# Patient Record
Sex: Female | Born: 1945
Health system: Southern US, Community
[De-identification: ages and names within clinical notes are randomized; demographics above are authoritative.]

## PROBLEM LIST (undated history)

## (undated) DIAGNOSIS — F329 Major depressive disorder, single episode, unspecified: Secondary | ICD-10-CM

## (undated) DIAGNOSIS — F32A Depression, unspecified: Secondary | ICD-10-CM

## (undated) DIAGNOSIS — I10 Essential (primary) hypertension: Secondary | ICD-10-CM

## (undated) HISTORY — PX: TONSILLECTOMY: SUR1361

## (undated) HISTORY — PX: ANKLE SURGERY: SHX546

---

## 1997-07-12 ENCOUNTER — Ambulatory Visit (HOSPITAL_COMMUNITY): Admission: RE | Admit: 1997-07-12 | Discharge: 1997-07-12 | Payer: Self-pay | Admitting: Family Medicine

## 2001-10-26 ENCOUNTER — Encounter: Payer: Self-pay | Admitting: *Deleted

## 2001-10-26 ENCOUNTER — Emergency Department (HOSPITAL_COMMUNITY): Admission: EM | Admit: 2001-10-26 | Discharge: 2001-10-26 | Payer: Self-pay | Admitting: *Deleted

## 2003-01-10 ENCOUNTER — Ambulatory Visit (HOSPITAL_BASED_OUTPATIENT_CLINIC_OR_DEPARTMENT_OTHER): Admission: RE | Admit: 2003-01-10 | Discharge: 2003-01-10 | Payer: Self-pay | Admitting: Family Medicine

## 2004-04-16 ENCOUNTER — Ambulatory Visit: Payer: Self-pay | Admitting: *Deleted

## 2004-07-23 ENCOUNTER — Ambulatory Visit: Payer: Self-pay | Admitting: Family Medicine

## 2004-07-26 ENCOUNTER — Encounter: Admission: RE | Admit: 2004-07-26 | Discharge: 2004-07-26 | Payer: Self-pay | Admitting: Family Medicine

## 2004-08-10 ENCOUNTER — Ambulatory Visit: Payer: Self-pay | Admitting: Family Medicine

## 2004-10-11 ENCOUNTER — Ambulatory Visit: Payer: Self-pay | Admitting: Family Medicine

## 2010-05-01 ENCOUNTER — Emergency Department (HOSPITAL_COMMUNITY): Payer: Medicare Other

## 2010-05-01 ENCOUNTER — Emergency Department (HOSPITAL_COMMUNITY)
Admission: EM | Admit: 2010-05-01 | Discharge: 2010-05-01 | Disposition: A | Discharge status: Home / Self Care | Payer: Medicare Other | Attending: Emergency Medicine | Admitting: Emergency Medicine

## 2010-05-01 DIAGNOSIS — E669 Obesity, unspecified: Secondary | ICD-10-CM | POA: Insufficient documentation

## 2010-05-01 DIAGNOSIS — M25569 Pain in unspecified knee: Secondary | ICD-10-CM | POA: Insufficient documentation

## 2010-05-01 DIAGNOSIS — I10 Essential (primary) hypertension: Secondary | ICD-10-CM | POA: Insufficient documentation

## 2010-05-01 DIAGNOSIS — X500XXA Overexertion from strenuous movement or load, initial encounter: Secondary | ICD-10-CM | POA: Insufficient documentation

## 2010-05-01 DIAGNOSIS — R229 Localized swelling, mass and lump, unspecified: Secondary | ICD-10-CM | POA: Insufficient documentation

## 2013-06-26 ENCOUNTER — Emergency Department (INDEPENDENT_AMBULATORY_CARE_PROVIDER_SITE_OTHER): Payer: Medicare Other

## 2013-06-26 ENCOUNTER — Emergency Department (INDEPENDENT_AMBULATORY_CARE_PROVIDER_SITE_OTHER)
Admission: EM | Admit: 2013-06-26 | Discharge: 2013-06-26 | Disposition: A | Discharge status: Home / Self Care | Payer: Medicare Other | Source: Home / Self Care | Attending: Family Medicine | Admitting: Family Medicine

## 2013-06-26 ENCOUNTER — Encounter (HOSPITAL_COMMUNITY): Payer: Self-pay | Admitting: Emergency Medicine

## 2013-06-26 DIAGNOSIS — M19019 Primary osteoarthritis, unspecified shoulder: Secondary | ICD-10-CM

## 2013-06-26 DIAGNOSIS — M19011 Primary osteoarthritis, right shoulder: Secondary | ICD-10-CM

## 2013-06-26 DIAGNOSIS — I10 Essential (primary) hypertension: Secondary | ICD-10-CM

## 2013-06-26 HISTORY — DX: Depression, unspecified: F32.A

## 2013-06-26 HISTORY — DX: Essential (primary) hypertension: I10

## 2013-06-26 HISTORY — DX: Major depressive disorder, single episode, unspecified: F32.9

## 2013-06-26 MED ORDER — LISINOPRIL-HYDROCHLOROTHIAZIDE 20-12.5 MG PO TABS: 1.0000 | ORAL_TABLET | Freq: Every day | ORAL | Status: DC

## 2013-06-26 MED ORDER — MELOXICAM 7.5 MG PO TABS: 7.5000 mg | ORAL_TABLET | Freq: Every day | ORAL | Status: DC

## 2013-06-26 MED ORDER — AMLODIPINE BESYLATE 5 MG PO TABS: 10.0000 mg | ORAL_TABLET | Freq: Every day | ORAL | Status: DC

## 2013-06-26 NOTE — ED Provider Notes (Signed)
CSN: [id number]     Arrival date & time 06/26/13  1727 History   First MD Initiated Contact with Patient 06/26/13 1753     Chief Complaint  Patient presents with  . Shoulder Pain   (Consider location/radiation/quality/duration/timing/severity/associated sxs/prior Treatment) Patient is a 68 y.o. female presenting with shoulder pain. The history is provided by the patient. No language interpreter was used.  Shoulder Pain This is a new problem. The current episode started more than 1 week ago. The problem occurs constantly. The problem has been gradually worsening. Pertinent negatives include no chest pain, no abdominal pain, no headaches and no shortness of breath. Nothing aggravates the symptoms. Nothing relieves the symptoms. She has tried nothing for the symptoms.  Pt complains of pain in right shoulder,  Pt has had similar in the past in her left shoulder.  Pt's blood pressure is high.  She has not taken blood pressure medication in 2 years.  No current MD  Past Medical History  Diagnosis Date  . Hypertension   . Depression    Past Surgical History  Procedure Laterality Date  . Tonsillectomy    . Ankle surgery     No family history on file. History  Substance Use Topics  . Smoking status: Never Smoker   . Smokeless tobacco: Not on file  . Alcohol Use: No   OB History   Grav Para Term Preterm Abortions TAB SAB Ect Mult Living                 Review of Systems  Respiratory: Negative for shortness of breath.   Cardiovascular: Negative for chest pain.  Gastrointestinal: Negative for abdominal pain.  Neurological: Negative for headaches.  All other systems reviewed and are negative.   Allergies  Review of patient's allergies indicates no known allergies.  Home Medications   Prior to Admission medications   Not on File   BP 201/96  Pulse 78  Temp(Src) 98.5 F (36.9 C) (Oral)  Resp 20  SpO2 97% Physical Exam  Nursing note and vitals reviewed. Constitutional: She  is oriented to person, place, and time. She appears well-developed and well-nourished.  HENT:  Head: Normocephalic.  Right Ear: External ear normal.  Left Ear: External ear normal.  Eyes: Conjunctivae are normal. Pupils are equal, round, and reactive to light.  Neck: Normal range of motion.  Cardiovascular: Normal rate and normal heart sounds.   Pulmonary/Chest: Effort normal and breath sounds normal.  Abdominal: Soft. Bowel sounds are normal.  Musculoskeletal: Normal range of motion.  Tender right shoulder,  Pain with range of motion,  nv and ns intact  Neurological: She is alert and oriented to person, place, and time. She has normal reflexes.  Skin: Skin is warm.  Psychiatric: She has a normal mood and affect.    ED Course  Procedures (including critical care time) Labs Review Labs Reviewed - No data to display  Imaging Review Dg Shoulder Right  06/26/2013   CLINICAL DATA:  Right shoulder pain.  EXAM: RIGHT SHOULDER - 2+ VIEW  COMPARISON:  None.  FINDINGS: There is no acute bony or joint abnormality. The patient has acromioclavicular and glenohumeral degenerative disease. Degenerative change appears worst about the acromioclavicular joint. Image right lung and ribs are unremarkable.  IMPRESSION: No acute finding.  Acromioclavicular and glenohumeral osteoarthritis.   Electronically Signed   By: Drusilla Kannerhomas  Dalessio M.D.   On: 06/26/2013 18:36     MDM   1. Arthritis of right shoulder region  2. HTN (hypertension)    I  I discussed with Dr.  Artis FlockKindl.   I will start pt on lisinopil hctz and amlodipine.    Pt given medloxicam for shoulder.  Pt given multiple referrals and I discussed importance of bp medication/management  Elson AreasLeslie K Alba Perillo, PA-C 06/26/13 1946

## 2013-06-26 NOTE — ED Notes (Signed)
Pain in shoulder , onset 4/15.  Patient has pain in both shoulders and upper arms.  No known injury.

## 2013-06-26 NOTE — ED Provider Notes (Signed)
Medical screening examination/treatment/procedure(s) were performed by resident physician or non-physician practitioner and as supervising physician I was immediately available for consultation/collaboration.   KINDL,JAMES DOUGLAS MD.   James D Kindl, MD 06/26/13 1957 

## 2013-06-26 NOTE — Discharge Instructions (Signed)
Arterial Hypertension °Arterial hypertension (high blood pressure) is a condition of elevated pressure in your blood vessels. Hypertension over a long period of time is a risk factor for strokes, heart attacks, and heart failure. It is also the leading cause of kidney (renal) failure.  °CAUSES  °· In Adults -- Over 90% of all hypertension has no known cause. This is called essential or primary hypertension. In the other 10% of people with hypertension, the increase in blood pressure is caused by another disorder. This is called secondary hypertension. Important causes of secondary hypertension are: °· Heavy alcohol use. °· Obstructive sleep apnea. °· Hyperaldosterosim (Conn's syndrome). °· Steroid use. °· Chronic kidney failure. °· Hyperparathyroidism. °· Medications. °· Renal artery stenosis. °· Pheochromocytoma. °· Cushing's disease. °· Coarctation of the aorta. °· Scleroderma renal crisis. °· Licorice (in excessive amounts). °· Drugs (cocaine, methamphetamine). °Your caregiver can explain any items above that apply to you. °· In Children -- Secondary hypertension is more common and should always be considered. °· Pregnancy -- Few women of childbearing age have high blood pressure. However, up to 10% of them develop hypertension of pregnancy. Generally, this will not harm the woman. It may be a sign of 3 complications of pregnancy: preeclampsia, HELLP syndrome, and eclampsia. Follow up and control with medication is necessary. °SYMPTOMS  °· This condition normally does not produce any noticeable symptoms. It is usually found during a routine exam. °· Malignant hypertension is a late problem of high blood pressure. It may have the following symptoms: °· Headaches. °· Blurred vision. °· End-organ damage (this means your kidneys, heart, lungs, and other organs are being damaged). °· Stressful situations can increase the blood pressure. If a person with normal blood pressure has their blood pressure go up while being  seen by their caregiver, this is often termed "white coat hypertension." Its importance is not known. It may be related with eventually developing hypertension or complications of hypertension. °· Hypertension is often confused with mental tension, stress, and anxiety. °DIAGNOSIS  °The diagnosis is made by 3 separate blood pressure measurements. They are taken at least 1 week apart from each other. If there is organ damage from hypertension, the diagnosis may be made without repeat measurements. °Hypertension is usually identified by having blood pressure readings: °· Above 140/90 mmHg measured in both arms, at 3 separate times, over a couple weeks. °· Over 130/80 mmHg should be considered a risk factor and may require treatment in patients with diabetes. °Blood pressure readings over 120/80 mmHg are called "pre-hypertension" even in non-diabetic patients. °To get a true blood pressure measurement, use the following guidelines. Be aware of the factors that can alter blood pressure readings. °· Take measurements at least 1 hour after caffeine. °· Take measurements 30 minutes after smoking and without any stress. This is another reason to quit smoking  it raises your blood pressure. °· Use a proper cuff size. Ask your caregiver if you are not sure about your cuff size. °· Most home blood pressure cuffs are automatic. They will measure systolic and diastolic pressures. The systolic pressure is the pressure reading at the start of sounds. Diastolic pressure is the pressure at which the sounds disappear. If you are elderly, measure pressures in multiple postures. Try sitting, lying or standing. °· Sit at rest for a minimum of 5 minutes before taking measurements. °· You should not be on any medications like decongestants. These are found in many cold medications. °· Record your blood pressure readings and review   them with your caregiver. °If you have hypertension: °· Your caregiver may do tests to be sure you do not have  secondary hypertension (see "causes" above). °· Your caregiver may also look for signs of metabolic syndrome. This is also called Syndrome X or Insulin Resistance Syndrome. You may have this syndrome if you have type 2 diabetes, abdominal obesity, and abnormal blood lipids in addition to hypertension. °· Your caregiver will take your medical and family history and perform a physical exam. °· Diagnostic tests may include blood tests (for glucose, cholesterol, potassium, and kidney function), a urinalysis, or an EKG. Other tests may also be necessary depending on your condition. °PREVENTION  °There are important lifestyle issues that you can adopt to reduce your chance of developing hypertension: °· Maintain a normal weight. °· Limit the amount of salt (sodium) in your diet. °· Exercise often. °· Limit alcohol intake. °· Get enough potassium in your diet. Discuss specific advice with your caregiver. °· Follow a DASH diet (dietary approaches to stop hypertension). This diet is rich in fruits, vegetables, and low-fat dairy products, and avoids certain fats. °PROGNOSIS  °Essential hypertension cannot be cured. Lifestyle changes and medical treatment can lower blood pressure and reduce complications. The prognosis of secondary hypertension depends on the underlying cause. Many people whose hypertension is controlled with medicine or lifestyle changes can live a normal, healthy life.  °RISKS AND COMPLICATIONS  °While high blood pressure alone is not an illness, it often requires treatment due to its short- and long-term effects on many organs. Hypertension increases your risk for: °· CVAs or strokes (cerebrovascular accident). °· Heart failure due to chronically high blood pressure (hypertensive cardiomyopathy). °· Heart attack (myocardial infarction). °· Damage to the retina (hypertensive retinopathy). °· Kidney failure (hypertensive nephropathy). °Your caregiver can explain list items above that apply to you. Treatment  of hypertension can significantly reduce the risk of complications. °TREATMENT  °· For overweight patients, weight loss and regular exercise are recommended. Physical fitness lowers blood pressure. °· Mild hypertension is usually treated with diet and exercise. A diet rich in fruits and vegetables, fat-free dairy products, and foods low in fat and salt (sodium) can help lower blood pressure. Decreasing salt intake decreases blood pressure in a 1/3 of people. °· Stop smoking if you are a smoker. °The steps above are highly effective in reducing blood pressure. While these actions are easy to suggest, they are difficult to achieve. Most patients with moderate or severe hypertension end up requiring medications to bring their blood pressure down to a normal level. There are several classes of medications for treatment. Blood pressure pills (antihypertensives) will lower blood pressure by their different actions. Lowering the blood pressure by 10 mmHg may decrease the risk of complications by as much as 25%. °The goal of treatment is effective blood pressure control. This will reduce your risk for complications. Your caregiver will help you determine the best treatment for you according to your lifestyle. What is excellent treatment for one person, may not be for you. °HOME CARE INSTRUCTIONS  °· Do not smoke. °· Follow the lifestyle changes outlined in the "Prevention" section. °· If you are on medications, follow the directions carefully. Blood pressure medications must be taken as prescribed. Skipping doses reduces their benefit. It also puts you at risk for problems. °· Follow up with your caregiver, as directed. °· If you are asked to monitor your blood pressure at home, follow the guidelines in the "Diagnosis" section above. °SEEK MEDICAL CARE   IF:   You think you are having medication side effects.  You have recurrent headaches or lightheadedness.  You have swelling in your ankles.  You have trouble with  your vision. SEEK IMMEDIATE MEDICAL CARE IF:   You have sudden onset of chest pain or pressure, difficulty breathing, or other symptoms of a heart attack.  You have a severe headache.  You have symptoms of a stroke (such as sudden weakness, difficulty speaking, difficulty walking). MAKE SURE YOU:   Understand these instructions.  Will watch your condition.  Will get help right away if you are not doing well or get worse. Document Released: 02/18/2005 Document Revised: 05/13/2011 Document Reviewed: 09/18/2006 [facility] Patient Information 2014 [employer], [place].  Arthritis, Nonspecific Arthritis is inflammation of a joint. This usually means pain, redness, warmth or swelling are present. One or more joints may be involved. There are a number of types of arthritis. Your caregiver may not be able to tell what type of arthritis you have right away. CAUSES  The most common cause of arthritis is the wear and tear on the joint (osteoarthritis). This causes damage to the cartilage, which can break down over time. The knees, hips, back and neck are most often affected by this type of arthritis. Other types of arthritis and common causes of joint pain include:  Sprains and other injuries near the joint. Sometimes minor sprains and injuries cause pain and swelling that develop hours later.  Rheumatoid arthritis. This affects hands, feet and knees. It usually affects both sides of your body at the same time. It is often associated with chronic ailments, fever, weight loss and general weakness.  Crystal arthritis. Gout and pseudo gout can cause occasional acute severe pain, redness and swelling in the foot, ankle, or knee.  Infectious arthritis. Bacteria can get into a joint through a break in overlying skin. This can cause infection of the joint. Bacteria and viruses can also spread through the blood and affect your joints.  Drug, infectious and allergy reactions. Sometimes joints can become mildly  painful and slightly swollen with these types of illnesses. SYMPTOMS   Pain is the main symptom.  Your joint or joints can also be red, swollen and warm or hot to the touch.  You may have a fever with certain types of arthritis, or even feel overall ill.  The joint with arthritis will hurt with movement. Stiffness is present with some types of arthritis. DIAGNOSIS  Your caregiver will suspect arthritis based on your description of your symptoms and on your exam. Testing may be needed to find the type of arthritis:  Blood and sometimes urine tests.  X-ray tests and sometimes CT or MRI scans.  Removal of fluid from the joint (arthrocentesis) is done to check for bacteria, crystals or other causes. Your caregiver (or a specialist) will numb the area over the joint with a local anesthetic, and use a needle to remove joint fluid for examination. This procedure is only minimally uncomfortable.  Even with these tests, your caregiver may not be able to tell what kind of arthritis you have. Consultation with a specialist (rheumatologist) may be helpful. TREATMENT  Your caregiver will discuss with you treatment specific to your type of arthritis. If the specific type cannot be determined, then the following general recommendations may apply. Treatment of severe joint pain includes:  Rest.  Elevation.  Anti-inflammatory medication (for example, ibuprofen) may be prescribed. Avoiding activities that cause increased pain.  Only take over-the-counter or prescription medicines for  pain and discomfort as recommended by your caregiver.  Cold packs over an inflamed joint may be used for 10 to 15 minutes every hour. Hot packs sometimes feel better, but do not use overnight. Do not use hot packs if you are diabetic without your caregiver's permission.  A cortisone shot into arthritic joints may help reduce pain and swelling.  Any acute arthritis that gets worse over the next 1 to 2 days needs to be  looked at to be sure there is no joint infection. Long-term arthritis treatment involves modifying activities and lifestyle to reduce joint stress jarring. This can include weight loss. Also, exercise is needed to nourish the joint cartilage and remove waste. This helps keep the muscles around the joint strong. HOME CARE INSTRUCTIONS   Do not take aspirin to relieve pain if gout is suspected. This elevates uric acid levels.  Only take over-the-counter or prescription medicines for pain, discomfort or fever as directed by your caregiver.  Rest the joint as much as possible.  If your joint is swollen, keep it elevated.  Use crutches if the painful joint is in your leg.  Drinking plenty of fluids may help for certain types of arthritis.  Follow your caregiver's dietary instructions.  Try low-impact exercise such as:  Swimming.  Water aerobics.  Biking.  Walking.  Morning stiffness is often relieved by a warm shower.  Put your joints through regular range-of-motion. SEEK MEDICAL CARE IF:   You do not feel better in 24 hours or are getting worse.  You have side effects to medications, or are not getting better with treatment. SEEK IMMEDIATE MEDICAL CARE IF:   You have a fever.  You develop severe joint pain, swelling or redness.  Many joints are involved and become painful and swollen.  There is severe back pain and/or leg weakness.  You have loss of bowel or bladder control. Document Released: 03/28/2004 Document Revised: 05/13/2011 Document Reviewed: 04/13/2008 [facility] Patient Information 2014 [employer], [place].

## 2013-06-28 ENCOUNTER — Emergency Department (INDEPENDENT_AMBULATORY_CARE_PROVIDER_SITE_OTHER)
Admission: EM | Admit: 2013-06-28 | Discharge: 2013-06-28 | Disposition: A | Discharge status: Home / Self Care | Payer: Medicare Other | Source: Home / Self Care | Attending: Family Medicine | Admitting: Family Medicine

## 2013-06-28 ENCOUNTER — Encounter (HOSPITAL_COMMUNITY): Payer: Self-pay | Admitting: Emergency Medicine

## 2013-06-28 DIAGNOSIS — M19011 Primary osteoarthritis, right shoulder: Secondary | ICD-10-CM

## 2013-06-28 DIAGNOSIS — M19019 Primary osteoarthritis, unspecified shoulder: Secondary | ICD-10-CM

## 2013-06-28 MED ORDER — TRAMADOL HCL 50 MG PO TABS: 50.0000 mg | ORAL_TABLET | Freq: Two times a day (BID) | ORAL | Status: DC | PRN

## 2013-06-28 NOTE — ED Provider Notes (Signed)
CSN: [id number]     Arrival date & time 06/28/13  1556 History   First MD Initiated Contact with Patient 06/28/13 1743     Chief Complaint  Patient presents with  . Shoulder Pain   (Consider location/radiation/quality/duration/timing/severity/associated sxs/prior Treatment) HPI Comments: Patient presents with 2 weeks of right shoulder discomfort. [facility] and diagnosed with OA of right shoulder and prescribed Mobic. States this medication has only brought her limited relief. Requests additional pain medication until she can be seen by her sport medicine MD, Dr. Jennette KettleNeal, on Jul 12, 2013. No new illness or injury. Pain described as aching in right anterior and lower shoulder.  The history is provided by the patient.    Past Medical History  Diagnosis Date  . Hypertension   . Depression    Past Surgical History  Procedure Laterality Date  . Tonsillectomy    . Ankle surgery     History reviewed. No pertinent family history. History  Substance Use Topics  . Smoking status: Never Smoker   . Smokeless tobacco: Not on file  . Alcohol Use: No   OB History   Grav Para Term Preterm Abortions TAB SAB Ect Mult Living                 Review of Systems  All other systems reviewed and are negative.   Allergies  Review of patient's allergies indicates no known allergies.  Home Medications   Prior to Admission medications   Medication Sig Start Date End Date Taking? Authorizing Provider  amLODipine (NORVASC) 5 MG tablet Take 2 tablets (10 mg total) by mouth daily. 06/26/13   Elson AreasLeslie K Sofia, PA-C  lisinopril-hydrochlorothiazide (ZESTORETIC) 20-12.5 MG per tablet Take 1 tablet by mouth daily. 06/26/13   Elson AreasLeslie K Sofia, PA-C  meloxicam (MOBIC) 7.5 MG tablet Take 1 tablet (7.5 mg total) by mouth daily. 06/26/13   Elson AreasLeslie K Sofia, PA-C   BP 145/57  Pulse 81  Temp(Src) 98.6 F (37 C) (Oral)  Resp 16  SpO2 98% Physical Exam  Vitals  reviewed. Constitutional: She is oriented to person, place, and time. She appears well-developed and well-nourished.  HENT:  Head: Normocephalic and atraumatic.  Eyes: Conjunctivae are normal. No scleral icterus.  Cardiovascular: Normal rate.   Pulmonary/Chest: Effort normal.  Musculoskeletal: Normal range of motion.       Right shoulder: She exhibits normal range of motion, no swelling, no effusion, no crepitus, no deformity, no laceration, normal pulse and normal strength.       Arms: Neurological: She is alert and oriented to person, place, and time.  Skin: Skin is warm and dry.  Psychiatric: She has a normal mood and affect. Her behavior is normal.    ED Course  Procedures (including critical care time) Labs Review Labs Reviewed - No data to display  Imaging Review No results found.   MDM   1. Osteoarthritis of right shoulder   Will add Ultram to patient's pain management regimen and advise continued follow up with Dr. Jennette KettleNeal in May 2015.     Jess BartersJennifer Lee ArenaPresson, GeorgiaPA 06/28/13 1944

## 2013-06-28 NOTE — ED Notes (Signed)
C/o right shoulder pain States both shoulders does hurt but more the right shoulder Patient was seen here and received mobic  States med is not controlling pain States she needs a pain med

## 2013-06-28 NOTE — ED Provider Notes (Signed)
Medical screening examination/treatment/procedure(s) were performed by resident physician or non-physician practitioner and as supervising physician I was immediately available for consultation/collaboration.   Marquavion Venhuizen DOUGLAS MD.   Jeyda Siebel D Zayvier Caravello, MD 06/28/13 2107 

## 2013-06-28 NOTE — Discharge Instructions (Signed)
Osteoarthritis Osteoarthritis is a disease that causes soreness and swelling (inflammation) of a joint. It occurs when the cartilage at the affected joint wears down. Cartilage acts as a cushion, covering the ends of bones where they meet to form a joint. Osteoarthritis is the most common form of arthritis. It often occurs in older people. The joints affected most often by this condition include those in the:  Ends of the fingers.  Thumbs.  Neck.  Lower back.  Knees.  Hips. CAUSES  Over time, the cartilage that covers the ends of bones begins to wear away. This causes bone to rub on bone, producing pain and stiffness in the affected joints.  RISK FACTORS Certain factors can increase your chances of having osteoarthritis, including:  Older age.  Excessive body weight.  Overuse of joints. SIGNS AND SYMPTOMS   Pain, swelling, and stiffness in the joint.  Over time, the joint may lose its normal shape.  Small deposits of bone (osteophytes) may grow on the edges of the joint.  Bits of bone or cartilage can break off and float inside the joint space. This may cause more pain and damage. DIAGNOSIS  Your health care provider will do a physical exam and ask about your symptoms. Various tests may be ordered, such as:  X-rays of the affected joint.  An MRI scan.  Blood tests to rule out other types of arthritis.  Joint fluid tests. This involves using a needle to draw fluid from the joint and examining the fluid under a microscope. TREATMENT  Goals of treatment are to control pain and improve joint function. Treatment plans may include:  A prescribed exercise program that allows for rest and joint relief.  A weight control plan.  Pain relief techniques, such as:  Properly applied heat and cold.  Electric pulses delivered to nerve endings under the skin (transcutaneous electrical nerve stimulation, TENS).  Massage.  Certain nutritional supplements.  Medicines to  control pain, such as:  Acetaminophen.  Nonsteroidal anti-inflammatory drugs (NSAIDs), such as naproxen.  Narcotic or central-acting agents, such as tramadol.  Corticosteroids. These can be given orally or as an injection.  Surgery to reposition the bones and relieve pain (osteotomy) or to remove loose pieces of bone and cartilage. Joint replacement may be needed in advanced states of osteoarthritis. HOME CARE INSTRUCTIONS   Only take over-the-counter or prescription medicines as directed by your health care provider. Take all medicines exactly as instructed.  Maintain a healthy weight. Follow your health care provider's instructions for weight control. This may include dietary instructions.  Exercise as directed. Your health care provider can recommend specific types of exercise. These may include:  Strengthening exercises These are done to strengthen the muscles that support joints affected by arthritis. They can be performed with weights or with exercise bands to add resistance.  Aerobic activities These are exercises, such as brisk walking or low-impact aerobics, that get your heart pumping.  Range-of-motion activities These keep your joints limber.  Balance and agility exercises These help you maintain daily living skills.  Rest your affected joints as directed by your health care provider.  Follow up with your health care provider as directed. SEEK MEDICAL CARE IF:   Your skin turns red.  You develop a rash in addition to your joint pain.  You have worsening joint pain. SEEK IMMEDIATE MEDICAL CARE IF:  You have a significant loss of weight or appetite.  You have a fever along with joint or muscle aches.  You have   night sweats. FOR MORE INFORMATION  National Institute of Arthritis and Musculoskeletal and Skin Diseases: www.niams.nih.gov National Institute on Aging: www.nia.nih.gov American College of Rheumatology: www.rheumatology.org Document Released: 02/18/2005  Document Revised: 12/09/2012 Document Reviewed: 10/26/2012 [employer] Patient Information 2014 [employer].  

## 2013-07-12 ENCOUNTER — Encounter: Payer: Self-pay | Admitting: Family Medicine

## 2013-07-12 ENCOUNTER — Ambulatory Visit (INDEPENDENT_AMBULATORY_CARE_PROVIDER_SITE_OTHER): Payer: Medicare Other | Admitting: Family Medicine

## 2013-07-12 VITALS — BP 120/80 | Ht 67.0 in | Wt 280.0 lb

## 2013-07-12 DIAGNOSIS — M25519 Pain in unspecified shoulder: Secondary | ICD-10-CM

## 2013-07-13 DIAGNOSIS — M25519 Pain in unspecified shoulder: Secondary | ICD-10-CM | POA: Insufficient documentation

## 2013-07-13 NOTE — Assessment & Plan Note (Signed)
We discussed options and decided to start her on home exercise program with followup in 4-6 weeks and when necessary.

## 2013-07-13 NOTE — Progress Notes (Signed)
   Subjective:    Patient ID: Amy Rivera, female    DOB: 1945/12/13, 68 y.o.   MRN: [id number]  HPI  Told her pain. Initially she had made the appointment for  right shoulder pain. She had been seen at urgent care for this and given some NSAIDs and tramadol which seems to have taking care of it. That pain was sharp and occurred every time she moves her shoulder. It is now almost totally resolved. Now her left shoulder is bothering her and that she has pain with overhead activities and pain at night. 4/10. No numbness or tingling in her hand.  PERTINENT  PMH / PSH: I have reviewed the patient's medications, allergies, past medical and surgical history. Pertinent findings that relate to today's visit / issues include: No prior history of shoulder injury or surgery. She does have a history of hypertension but no diagnosis of diabetes mellitus.  Review of Systems No unusual weight change, no fever sweats or chills. Has noted no redness or warmth of either shoulder. No numbness in her hands.    Objective:   Physical Exam Vital signs are reviewed GENERAL: Well-developed overweight female no acute distress Shoulders: Bilaterally symmetrical. On the right she has full strength and full range of motion in all planes the rotator cuff. The biceps tendon is nontender to palpation. She has normal biceps strength. There is no tenderness over the a.c. joint. The left shoulder reveals some very mild tenderness over the biceps tendon area but intact biceps strength. She has pain with internal rotation and supraspinatus testing. Her strength seems intact although it is somewhat difficult to determine secondary to her pain with examination. ULTRASOUND: Somewhat difficult ultrasound secondary to habitus but most of the rotator cuff was seen and am fairly confident there is no significant tear in any of the rotator cuff muscles. There some mild arthropathy at the a.c. joint but no effusion. The biceps tendon  appears to have a small amount of fluid around it and she has a peak osteophytes notable in the bicipital groove.  .        Assessment & Plan:

## 2014-08-12 ENCOUNTER — Other Ambulatory Visit: Payer: Self-pay | Admitting: Registered Nurse

## 2014-08-12 ENCOUNTER — Other Ambulatory Visit: Payer: Self-pay

## 2014-08-12 DIAGNOSIS — Z1231 Encounter for screening mammogram for malignant neoplasm of breast: Secondary | ICD-10-CM

## 2014-08-16 ENCOUNTER — Ambulatory Visit
Admission: RE | Admit: 2014-08-16 | Discharge: 2014-08-16 | Disposition: A | Discharge status: Home / Self Care | Payer: Medicare Other | Source: Ambulatory Visit | Attending: Registered Nurse | Admitting: Registered Nurse

## 2014-08-16 DIAGNOSIS — Z1231 Encounter for screening mammogram for malignant neoplasm of breast: Secondary | ICD-10-CM

## 2017-09-12 ENCOUNTER — Ambulatory Visit (HOSPITAL_COMMUNITY)
Admission: EM | Admit: 2017-09-12 | Discharge: 2017-09-12 | Disposition: A | Discharge status: Home / Self Care | Payer: Medicare Other | Attending: Family Medicine | Admitting: Family Medicine

## 2017-09-12 ENCOUNTER — Encounter (HOSPITAL_COMMUNITY): Payer: Self-pay

## 2017-09-12 DIAGNOSIS — R21 Rash and other nonspecific skin eruption: Secondary | ICD-10-CM | POA: Diagnosis not present

## 2017-09-12 DIAGNOSIS — I10 Essential (primary) hypertension: Secondary | ICD-10-CM

## 2017-09-12 MED ORDER — HYDROXYZINE HCL 25 MG PO TABS: 25.0000 mg | ORAL_TABLET | ORAL | 0 refills | Status: DC | PRN

## 2017-09-12 MED ORDER — ZESTORETIC 20-12.5 MG PO TABS: 1.0000 | ORAL_TABLET | Freq: Every day | ORAL | 0 refills | Status: DC

## 2017-09-12 MED ORDER — TRIAMCINOLONE ACETONIDE 0.1 % EX CREA: 1.0000 "application " | TOPICAL_CREAM | Freq: Two times a day (BID) | CUTANEOUS | 0 refills | Status: DC

## 2017-09-12 NOTE — ED Triage Notes (Signed)
Pt presents with left arm rash that is swollen and red

## 2017-09-12 NOTE — Discharge Instructions (Signed)
Use the cream 2 or 3 times a day to reduce rash and itching Hydroxyzine is a pill to take for itching.  Caution drowsiness. I have refilled your blood pressure medicine.  It is important to see a PCP and stay on blood pressure medication. Return as needed

## 2017-09-12 NOTE — ED Provider Notes (Signed)
[facility]    CSN: [id number] Arrival date & time: 09/12/17  4098     History   Chief Complaint Chief Complaint  Patient presents with  . Rash    left arm    HPI Amy Rivera is a 72 y.o. female.   HPI  Patient is here complaining of a rash on both arms.  Is been present for couple weeks.  It itches terribly.  She has not put any creams on it.  No new soap lotion or powder.  No new foods or medication.  He feels like it is spreading.  She is never had this rash before.  Does not have sensitive skin or eczema as a role. She also has hypertension.  She states she stopped all her medication "months ago".  She does not know if she still can go to her PCP because it has been so long.  She is not having any headaches or dizzy spells.  She was unaware that her blood pressure is elevated.  I explained to her the importance of staying on her blood pressure medicine for long-term maintenance and prevention of heart disease.  Putting her back on 1 of her blood pressure pills, and have advised her to follow-up her blood pressure.  I am going to refer her to a PCP for ongoing management.  Past Medical History:  Diagnosis Date  . Depression   . Hypertension     Patient Active Problem List   Diagnosis Date Noted  . Pain in joint, shoulder region 07/13/2013    Past Surgical History:  Procedure Laterality Date  . ANKLE SURGERY    . TONSILLECTOMY      OB History   None      Home Medications    Prior to Admission medications   Medication Sig Start Date End Date Taking? Authorizing Provider  hydrOXYzine (ATARAX/VISTARIL) 25 MG tablet Take 1-2 tablets (25-50 mg total) by mouth every 4 (four) hours as needed. 09/12/17   Eustace Moore, MD  triamcinolone cream (KENALOG) 0.1 % Apply 1 application topically 2 (two) times daily. 09/12/17   Eustace Moore, MD  ZESTORETIC 20-12.5 MG tablet Take 1 tablet by mouth daily. 09/12/17   Eustace Moore, MD    Family  History History reviewed. No pertinent family history. Patient states family history of hypertension and heart disease  social History Social History   Tobacco Use  . Smoking status: Never Smoker  Substance Use Topics  . Alcohol use: No  . Drug use: No     Allergies   Patient has no known allergies.   Review of Systems Review of Systems  Constitutional: Negative for chills and fever.  HENT: Negative for ear pain and sore throat.   Eyes: Negative for photophobia, pain and visual disturbance.  Respiratory: Negative for cough and shortness of breath.   Cardiovascular: Negative for chest pain and palpitations.  Gastrointestinal: Negative for abdominal pain and vomiting.  Genitourinary: Negative for dysuria and hematuria.  Musculoskeletal: Negative for arthralgias and back pain.  Skin: Positive for rash. Negative for color change.  Neurological: Negative for seizures, syncope and headaches.  Psychiatric/Behavioral: Positive for sleep disturbance.       Rash keeps her awake  All other systems reviewed and are negative.    Physical Exam Triage Vital Signs ED Triage Vitals  Enc Vitals Group     BP 09/12/17 0836 (!) 184/91     Pulse Rate 09/12/17 0836 89  Resp 09/12/17 0836 18     Temp 09/12/17 0836 98.3 F (36.8 C)     Temp Source 09/12/17 0836 Oral     SpO2 09/12/17 0836 97 %     Weight --      Height --      Head Circumference --      Peak Flow --      Pain Score 09/12/17 0834 1     Pain Loc --      Pain Edu? --      Excl. in GC? --    No data found.  Updated Vital Signs BP (!) 184/91 (BP Location: Right Arm)   Pulse 89   Temp 98.3 F (36.8 C) (Oral)   Resp 18   SpO2 97%      Physical Exam  Constitutional: She appears well-developed and well-nourished. No distress.  HENT:  Head: Normocephalic and atraumatic.  Mouth/Throat: Oropharynx is clear and moist.  Eyes: Pupils are equal, round, and reactive to light. Conjunctivae are normal.  Neck: Normal  range of motion. Neck supple.  Cardiovascular: Normal rate, regular rhythm and normal heart sounds.  Pulmonary/Chest: Effort normal. No stridor. No respiratory distress. She has no wheezes.  Abdominal: Soft. She exhibits no distension.  Musculoskeletal: Normal range of motion. She exhibits no edema.  Neurological: She is alert.  Skin: Skin is warm and dry.  Both forearms with scattered small papules, some excoriated.  No vesicles.  No soft tissue swelling or tenderness.  Psychiatric: She has a normal mood and affect. Her behavior is normal.     UC Treatments / Results  Labs (all labs ordered are listed, but only abnormal results are displayed) Labs Reviewed - No data to display  EKG None  Radiology No results found.  Procedures Procedures (including critical care time)  Medications Ordered in UC Medications - No data to display  Initial Impression / Assessment and Plan / UC Course  I have reviewed the triage vital signs and the nursing notes.  Pertinent labs & imaging results that were available during my care of the patient were reviewed by me and considered in my medical decision making (see chart for details).      Final Clinical Impressions(s) / UC Diagnoses   Final diagnoses:  Rash and nonspecific skin eruption  Essential hypertension     Discharge Instructions     Use the cream 2 or 3 times a day to reduce rash and itching Hydroxyzine is a pill to take for itching.  Caution drowsiness. I have refilled your blood pressure medicine.  It is important to see a PCP and stay on blood pressure medication. Return as needed   ED Prescriptions    Medication Sig Dispense Auth. Provider   ZESTORETIC 20-12.5 MG tablet Take 1 tablet by mouth daily. 90 tablet Eustace MooreNelson, Yvonne Sue, MD   hydrOXYzine (ATARAX/VISTARIL) 25 MG tablet Take 1-2 tablets (25-50 mg total) by mouth every 4 (four) hours as needed. 20 tablet Eustace MooreNelson, Yvonne Sue, MD   triamcinolone cream (KENALOG) 0.1 %  Apply 1 application topically 2 (two) times daily. 30 g Eustace MooreNelson, Yvonne Sue, MD     Controlled Substance Prescriptions [place] Controlled Substance Registry consulted? Not Applicable   Eustace MooreNelson, Yvonne Sue, MD 09/12/17 1123

## 2018-03-07 ENCOUNTER — Ambulatory Visit (HOSPITAL_COMMUNITY)
Admission: EM | Admit: 2018-03-07 | Discharge: 2018-03-07 | Disposition: A | Discharge status: Home / Self Care | Payer: Medicare Other | Attending: Family Medicine | Admitting: Family Medicine

## 2018-03-07 ENCOUNTER — Encounter (HOSPITAL_COMMUNITY): Payer: Self-pay

## 2018-03-07 ENCOUNTER — Other Ambulatory Visit: Payer: Self-pay

## 2018-03-07 DIAGNOSIS — M5442 Lumbago with sciatica, left side: Secondary | ICD-10-CM | POA: Insufficient documentation

## 2018-03-07 DIAGNOSIS — G8929 Other chronic pain: Secondary | ICD-10-CM | POA: Insufficient documentation

## 2018-03-07 MED ORDER — PREDNISONE 20 MG PO TABS: 20.0000 mg | ORAL_TABLET | Freq: Two times a day (BID) | ORAL | 0 refills | Status: AC

## 2018-03-07 NOTE — Discharge Instructions (Signed)
Continue conservative management of rest, ice, and gentle stretches Prescribed prednisone.  Take as directed and to completion Follow up with Amy Courts FNP to establish care Return or go to the ER if you have any new or worsening symptoms (fever, chills, chest pain, abdominal pain, changes in bowel or bladder habits, pain radiating into lower legs, numbness or tingling in pelvis etc...)

## 2018-03-07 NOTE — ED Triage Notes (Signed)
Pt cc left leg pain x 1 week . Pt not sure if she fell or not. ( pain and swelling )

## 2018-03-07 NOTE — ED Provider Notes (Signed)
[facility]   [id number] 03/07/18 Arrival Time: 1357  CC: Left leg pain  SUBJECTIVE: History from: patient. Amy Rivera is a 73 y.o. female complains of left lower extremity pain that began 1 week ago.  Denies a precipitating event or specific injury.  Localizes the pain to the low back and radiates down left leg.  Describes the pain as constant and sharp in character.  Has tried OTC medications with temporary relief.  Symptoms are made worse with raising left leg.  Denies similar symptoms in the past.  Denies fever, chills, nausea, vomiting, erythema, ecchymosis, effusion, weakness, numbness and tingling, saddle paresthesia, loss of bowel or bladder function.    ROS: As per HPI.  Past Medical History:  Diagnosis Date  . Depression   . Hypertension    Past Surgical History:  Procedure Laterality Date  . ANKLE SURGERY    . TONSILLECTOMY     No Known Allergies No current facility-administered medications on file prior to encounter.    No current outpatient medications on file prior to encounter.   Social History   Socioeconomic History  . Marital status: Divorced    Spouse name: Not on file  . Number of children: Not on file  . Years of education: Not on file  . Highest education level: Not on file  Occupational History  . Not on file  Social Needs  . Financial resource strain: Not on file  . Food insecurity:    Worry: Not on file    Inability: Not on file  . Transportation needs:    Medical: Not on file    Non-medical: Not on file  Tobacco Use  . Smoking status: Never Smoker  . Smokeless tobacco: Never Used  Substance and Sexual Activity  . Alcohol use: No  . Drug use: No  . Sexual activity: Not on file  Lifestyle  . Physical activity:    Days per week: Not on file    Minutes per session: Not on file  . Stress: Not on file  Relationships  . Social connections:    Talks on phone: Not on file    Gets together: Not on file    Attends religious  service: Not on file    Active member of club or organization: Not on file    Attends meetings of clubs or organizations: Not on file    Relationship status: Not on file  . Intimate partner violence:    Fear of current or ex partner: Not on file    Emotionally abused: Not on file    Physically abused: Not on file    Forced sexual activity: Not on file  Other Topics Concern  . Not on file  Social History Narrative  . Not on file   History reviewed. No pertinent family history.  OBJECTIVE:  Vitals:   03/07/18 1537 03/07/18 1540  BP:  (!) 160/75  Pulse:  72  Resp:  18  Temp:  98.6 F (37 C)  SpO2:  100%  Weight: 265 lb (120.2 kg)     General appearance: AOx3; in no acute distress.  Head: NCAT Lungs: CTA bilaterally Heart: RRR.   Musculoskeletal: Lumbar; limited due to pt sitting in wheelchair Inspection: Skin warm, dry, clear and intact without obvious erythema, effusion, or ecchymosis.  Palpation: TTP to LT low back; no midline tenderness ROM: unable to assess Strength: 5/5 hip flexion, 5/5 knee abduction, 5/5 knee adduction, 5/5 knee flexion, 5/5 knee extension, 5/5 dorsiflexion, 5/5 plantar flexion +  SLR on lt Skin: warm and dry Neurologic: Sitting in wheelchair; Sensation intact about the lower extremities Psychological: alert and cooperative; normal mood and affect  ASSESSMENT & PLAN:  1. Chronic left-sided low back pain with left-sided sciatica     Meds ordered this encounter  Medications  . predniSONE (DELTASONE) 20 MG tablet    Sig: Take 1 tablet (20 mg total) by mouth 2 (two) times daily with a meal for 5 days.    Dispense:  10 tablet    Refill:  0    Order Specific Question:   Supervising Provider    Answer:   Eustace Moore [[id number]]    Continue conservative management of rest, ice, and gentle stretches Prescribed prednisone.  Take as directed and to completion Follow up with Joaquin Courts FNP to establish care Return or go to the ER if you  have any new or worsening symptoms (fever, chills, chest pain, abdominal pain, changes in bowel or bladder habits, pain radiating into lower legs, numbness or tingling in pelvis etc...)   Reviewed expectations re: course of current medical issues. Questions answered. Outlined signs and symptoms indicating need for more acute intervention. Patient verbalized understanding. After Visit Summary given.    Rennis Harding, PA-C 03/07/18 1644

## 2018-03-11 ENCOUNTER — Telehealth (HOSPITAL_COMMUNITY): Payer: Self-pay | Admitting: Emergency Medicine

## 2018-03-11 ENCOUNTER — Ambulatory Visit (HOSPITAL_COMMUNITY)
Admission: EM | Admit: 2018-03-11 | Discharge: 2018-03-11 | Disposition: A | Discharge status: Home / Self Care | Payer: Medicare Other | Attending: Family Medicine | Admitting: Family Medicine

## 2018-03-11 DIAGNOSIS — B029 Zoster without complications: Secondary | ICD-10-CM | POA: Diagnosis not present

## 2018-03-11 MED ORDER — TRAMADOL HCL 50 MG PO TABS
50.0000 mg | ORAL_TABLET | Freq: Two times a day (BID) | ORAL | 0 refills | Status: AC | PRN
Start: 2018-03-11 — End: 2018-03-16

## 2018-03-11 MED ORDER — VALACYCLOVIR HCL 1 G PO TABS: 1000.0000 mg | ORAL_TABLET | Freq: Three times a day (TID) | ORAL | 0 refills | Status: AC

## 2018-03-11 NOTE — ED Notes (Signed)
Bed: UC01 Expected date:  Expected time:  Means of arrival:  Comments: For appointment 

## 2018-03-11 NOTE — Discharge Instructions (Signed)
Continue with prednisone Rest and use ice/heat as needed for symptomatic relief Prescribed valacyclovir 1000mg  3x/day for 10 days Use OTC medications such as ibuprofen/ tylenol.  Use tramadol as needed for break-through pain Follow up with PCP in 7-10 days if rash is still present Follow up with PCP if symptoms of burning, stinging, tingling or numbness occur after rash resolves, you may need additional treatment Return here or go to ER if you have any new or worsening symptoms (such as eye involvement, severe pain, or signs of secondary infection such as fever, chills, nausea, vomiting, discharge, redness or warmth over site of rash)

## 2018-03-11 NOTE — ED Provider Notes (Signed)
[facility]   [id number] 03/11/18 Arrival Time: 1650  CC: Rash  SUBJECTIVE:  Shawnika Riskin is a 73 y.o. female who presents with a rash on left leg that began 2 days ago.  Was seen on 03/07/17 and treated with prednisone for sciatica.  Localizes the rash to left leg.  Describes it as itchy and red.  Has tried been taking prednisone with minimal relief.  Denies aggravating factors. Reports similar symptoms in the past.  Denies fever, chills, nausea, vomiting, discharge, SOB, chest pain, abdominal pain, changes in bowel or bladder function.   ROS: As per HPI.  Past Medical History:  Diagnosis Date  . Depression   . Hypertension    Past Surgical History:  Procedure Laterality Date  . ANKLE SURGERY    . TONSILLECTOMY     No Known Allergies No current facility-administered medications on file prior to encounter.    Current Outpatient Medications on File Prior to Encounter  Medication Sig Dispense Refill  . predniSONE (DELTASONE) 20 MG tablet Take 1 tablet (20 mg total) by mouth 2 (two) times daily with a meal for 5 days. 10 tablet 0   Social History   Socioeconomic History  . Marital status: Divorced    Spouse name: Not on file  . Number of children: Not on file  . Years of education: Not on file  . Highest education level: Not on file  Occupational History  . Not on file  Social Needs  . Financial resource strain: Not on file  . Food insecurity:    Worry: Not on file    Inability: Not on file  . Transportation needs:    Medical: Not on file    Non-medical: Not on file  Tobacco Use  . Smoking status: Never Smoker  . Smokeless tobacco: Never Used  Substance and Sexual Activity  . Alcohol use: No  . Drug use: No  . Sexual activity: Not on file  Lifestyle  . Physical activity:    Days per week: Not on file    Minutes per session: Not on file  . Stress: Not on file  Relationships  . Social connections:    Talks on phone: Not on file    Gets together: Not  on file    Attends religious service: Not on file    Active member of club or organization: Not on file    Attends meetings of clubs or organizations: Not on file    Relationship status: Not on file  . Intimate partner violence:    Fear of current or ex partner: Not on file    Emotionally abused: Not on file    Physically abused: Not on file    Forced sexual activity: Not on file  Other Topics Concern  . Not on file  Social History Narrative  . Not on file   No family history on file.  OBJECTIVE: Vitals:   03/11/18 1717  BP: 136/83  Pulse: 89  Resp: 18  Temp: 97.9 F (36.6 C)  TempSrc: Oral  SpO2: 97%    General appearance: alert; no distress Head: NCAT Lungs: normal respiratory effort Extremities: no edema Skin: warm and dry; crops of vesicular lesions with surrounding erythema along L3 dermatome, does not cross midline; mildly tender to palpation; no obvious drainage (see picture below) Psychological: alert and cooperative; normal mood and affect        ASSESSMENT & PLAN:  1. Herpes zoster without complication     Meds ordered this  encounter  Medications  . valACYclovir (VALTREX) 1000 MG tablet    Sig: Take 1 tablet (1,000 mg total) by mouth 3 (three) times daily for 10 days.    Dispense:  30 tablet    Refill:  0    Order Specific Question:   Supervising Provider    Answer:   Eustace Moore [[id number]]  . traMADol (ULTRAM) 50 MG tablet    Sig: Take 1 tablet (50 mg total) by mouth every 12 (twelve) hours as needed for up to 5 days for moderate pain or severe pain.    Dispense:  10 tablet    Refill:  0    Order Specific Question:   Supervising Provider    Answer:   Eustace Moore [[id number]]   Continue with prednisone Rest and use ice/heat as needed for symptomatic relief Prescribed valacyclovir 1000mg  3x/day for 10 days Use OTC medications such as ibuprofen/ tylenol.  Use tramadol as needed for break-through pain Follow up with PCP in 7-10 days if  rash is still present Follow up with PCP if symptoms of burning, stinging, tingling or numbness occur after rash resolves, you may need additional treatment Return here or go to ER if you have any new or worsening symptoms (such as eye involvement, severe pain, or signs of secondary infection such as fever, chills, nausea, vomiting, discharge, redness or warmth over site of rash)   Reviewed expectations re: course of current medical issues. Questions answered. Outlined signs and symptoms indicating need for more acute intervention. Patient verbalized understanding. After Visit Summary given.   Rennis Harding, PA-C 03/11/18 1832

## 2018-03-11 NOTE — ED Triage Notes (Signed)
See provider note for triage.

## 2018-03-11 NOTE — Telephone Encounter (Signed)
Pt called stating she had a rash on her leg at her visit on 1/4 and she started taking her prednisone and the rash became worse. Per [place] PA, pt needs to be seen. Appt made for pt at 5pm today. Pt agreeable to plan.

## 2018-11-04 DIAGNOSIS — M25512 Pain in left shoulder: Secondary | ICD-10-CM | POA: Diagnosis not present

## 2018-11-04 DIAGNOSIS — Z79899 Other long term (current) drug therapy: Secondary | ICD-10-CM | POA: Diagnosis not present

## 2018-11-04 DIAGNOSIS — Z1159 Encounter for screening for other viral diseases: Secondary | ICD-10-CM | POA: Diagnosis not present

## 2018-11-04 DIAGNOSIS — Z008 Encounter for other general examination: Secondary | ICD-10-CM | POA: Diagnosis not present

## 2018-11-04 DIAGNOSIS — Z23 Encounter for immunization: Secondary | ICD-10-CM | POA: Diagnosis not present

## 2018-11-04 DIAGNOSIS — Z0000 Encounter for general adult medical examination without abnormal findings: Secondary | ICD-10-CM | POA: Diagnosis not present

## 2018-11-04 DIAGNOSIS — Z0001 Encounter for general adult medical examination with abnormal findings: Secondary | ICD-10-CM | POA: Diagnosis not present

## 2018-11-04 DIAGNOSIS — F320 Major depressive disorder, single episode, mild: Secondary | ICD-10-CM | POA: Diagnosis not present

## 2018-11-04 DIAGNOSIS — M25511 Pain in right shoulder: Secondary | ICD-10-CM | POA: Diagnosis not present

## 2018-11-04 DIAGNOSIS — Z6841 Body Mass Index (BMI) 40.0 and over, adult: Secondary | ICD-10-CM | POA: Diagnosis not present

## 2018-11-06 ENCOUNTER — Other Ambulatory Visit: Payer: Self-pay | Admitting: Internal Medicine

## 2018-11-10 ENCOUNTER — Other Ambulatory Visit: Payer: Self-pay | Admitting: Internal Medicine

## 2018-11-10 DIAGNOSIS — M25512 Pain in left shoulder: Secondary | ICD-10-CM

## 2018-11-10 DIAGNOSIS — M25511 Pain in right shoulder: Secondary | ICD-10-CM

## 2018-11-10 DIAGNOSIS — G8929 Other chronic pain: Secondary | ICD-10-CM

## 2018-12-07 ENCOUNTER — Ambulatory Visit
Admission: RE | Admit: 2018-12-07 | Discharge: 2018-12-07 | Disposition: A | Discharge status: Home / Self Care | Payer: Medicare Other | Source: Ambulatory Visit | Attending: Internal Medicine | Admitting: Internal Medicine

## 2018-12-07 ENCOUNTER — Other Ambulatory Visit: Payer: Self-pay | Admitting: Internal Medicine

## 2018-12-07 ENCOUNTER — Other Ambulatory Visit: Payer: Self-pay

## 2018-12-07 DIAGNOSIS — M25512 Pain in left shoulder: Secondary | ICD-10-CM

## 2018-12-07 DIAGNOSIS — M25511 Pain in right shoulder: Secondary | ICD-10-CM

## 2018-12-07 DIAGNOSIS — G8929 Other chronic pain: Secondary | ICD-10-CM

## 2018-12-07 DIAGNOSIS — M19012 Primary osteoarthritis, left shoulder: Secondary | ICD-10-CM | POA: Diagnosis not present

## 2018-12-07 DIAGNOSIS — M75101 Unspecified rotator cuff tear or rupture of right shoulder, not specified as traumatic: Secondary | ICD-10-CM | POA: Diagnosis not present

## 2018-12-07 DIAGNOSIS — M542 Cervicalgia: Secondary | ICD-10-CM | POA: Diagnosis not present

## 2018-12-07 MED ORDER — GADOBENATE DIMEGLUMINE 529 MG/ML IV SOLN
20.0000 mL | Freq: Once | INTRAVENOUS | Status: AC | PRN
  Administered 2018-12-07: 11:00:00 20 mL via INTRAVENOUS

## 2018-12-30 DIAGNOSIS — M6281 Muscle weakness (generalized): Secondary | ICD-10-CM | POA: Diagnosis not present

## 2018-12-30 DIAGNOSIS — M25512 Pain in left shoulder: Secondary | ICD-10-CM | POA: Diagnosis not present

## 2018-12-30 DIAGNOSIS — M25511 Pain in right shoulder: Secondary | ICD-10-CM | POA: Diagnosis not present

## 2018-12-30 DIAGNOSIS — R293 Abnormal posture: Secondary | ICD-10-CM | POA: Diagnosis not present

## 2019-07-14 ENCOUNTER — Emergency Department (HOSPITAL_COMMUNITY): Payer: Medicare Other

## 2019-07-14 ENCOUNTER — Emergency Department (HOSPITAL_BASED_OUTPATIENT_CLINIC_OR_DEPARTMENT_OTHER)
Admission: EM | Admit: 2019-07-14 | Discharge: 2019-07-14 | Disposition: A | Discharge status: Home / Self Care | Payer: Medicare Other | Source: Home / Self Care

## 2019-07-14 ENCOUNTER — Other Ambulatory Visit: Payer: Self-pay

## 2019-07-14 ENCOUNTER — Emergency Department (HOSPITAL_COMMUNITY)
Admission: EM | Admit: 2019-07-14 | Discharge: 2019-07-14 | Disposition: A | Discharge status: Home / Self Care | Payer: Medicare Other | Attending: Emergency Medicine | Admitting: Emergency Medicine

## 2019-07-14 ENCOUNTER — Encounter (HOSPITAL_COMMUNITY): Payer: Self-pay | Admitting: Emergency Medicine

## 2019-07-14 DIAGNOSIS — R52 Pain, unspecified: Secondary | ICD-10-CM | POA: Diagnosis not present

## 2019-07-14 DIAGNOSIS — I10 Essential (primary) hypertension: Secondary | ICD-10-CM | POA: Insufficient documentation

## 2019-07-14 DIAGNOSIS — M2550 Pain in unspecified joint: Secondary | ICD-10-CM | POA: Diagnosis not present

## 2019-07-14 DIAGNOSIS — M79605 Pain in left leg: Secondary | ICD-10-CM | POA: Diagnosis present

## 2019-07-14 DIAGNOSIS — M1712 Unilateral primary osteoarthritis, left knee: Secondary | ICD-10-CM | POA: Insufficient documentation

## 2019-07-14 DIAGNOSIS — M5442 Lumbago with sciatica, left side: Secondary | ICD-10-CM | POA: Diagnosis not present

## 2019-07-14 DIAGNOSIS — R5381 Other malaise: Secondary | ICD-10-CM | POA: Diagnosis not present

## 2019-07-14 DIAGNOSIS — M545 Low back pain: Secondary | ICD-10-CM | POA: Diagnosis not present

## 2019-07-14 DIAGNOSIS — M1612 Unilateral primary osteoarthritis, left hip: Secondary | ICD-10-CM | POA: Diagnosis not present

## 2019-07-14 DIAGNOSIS — M25462 Effusion, left knee: Secondary | ICD-10-CM | POA: Diagnosis not present

## 2019-07-14 DIAGNOSIS — M5441 Lumbago with sciatica, right side: Secondary | ICD-10-CM | POA: Diagnosis not present

## 2019-07-14 DIAGNOSIS — M7989 Other specified soft tissue disorders: Secondary | ICD-10-CM

## 2019-07-14 DIAGNOSIS — G8929 Other chronic pain: Secondary | ICD-10-CM | POA: Diagnosis not present

## 2019-07-14 DIAGNOSIS — R609 Edema, unspecified: Secondary | ICD-10-CM | POA: Diagnosis not present

## 2019-07-14 DIAGNOSIS — Z7401 Bed confinement status: Secondary | ICD-10-CM | POA: Diagnosis not present

## 2019-07-14 DIAGNOSIS — M5432 Sciatica, left side: Secondary | ICD-10-CM

## 2019-07-14 LAB — BASIC METABOLIC PANEL
Anion gap: 10 (ref 5–15)
BUN: 14 mg/dL (ref 8–23)
CO2: 25 mmol/L (ref 22–32)
Calcium: 9.3 mg/dL (ref 8.9–10.3)
Chloride: 104 mmol/L (ref 98–111)
Creatinine, Ser: 0.59 mg/dL (ref 0.44–1.00)
GFR calc Af Amer: >60 mL/min (ref >60)
GFR calc non Af Amer: >60 mL/min (ref >60)
Glucose, Bld: 91 mg/dL (ref 70–99)
Potassium: 4.3 mmol/L (ref 3.5–5.1)
Sodium: 139 mmol/L (ref 135–145)

## 2019-07-14 MED ORDER — LIDOCAINE 5 % EX PTCH: 1.0000 | MEDICATED_PATCH | CUTANEOUS | 0 refills | Status: DC

## 2019-07-14 MED ORDER — LIDOCAINE 5 % EX PTCH
1.0000 | MEDICATED_PATCH | CUTANEOUS | Status: DC
  Administered 2019-07-14: 1 via TRANSDERMAL
  Filled 2019-07-14: qty 1

## 2019-07-14 MED ORDER — ACETAMINOPHEN 500 MG PO TABS: 500.0000 mg | ORAL_TABLET | Freq: Four times a day (QID) | ORAL | 0 refills | Status: AC | PRN

## 2019-07-14 MED ORDER — KETOROLAC TROMETHAMINE 60 MG/2ML IM SOLN
60.0000 mg | Freq: Once | INTRAMUSCULAR | Status: AC
  Administered 2019-07-14: 60 mg via INTRAMUSCULAR
  Filled 2019-07-14: qty 2

## 2019-07-14 MED ORDER — ACETAMINOPHEN 325 MG PO TABS
650.0000 mg | ORAL_TABLET | Freq: Once | ORAL | Status: AC
  Administered 2019-07-14: 650 mg via ORAL
  Filled 2019-07-14: qty 2

## 2019-07-14 NOTE — ED Provider Notes (Signed)
[facility] DEPT Provider Note   CSN: [id number] Arrival date & time: 07/14/19  1354     History Chief Complaint  Patient presents with  . Leg Pain    Amy Rivera is a 74 y.o. female.  HPI 74 year old female with a history of hypertension. Left-sided sciatica presents the ER for pain in her leg which began yesterday after she attempted to get up from a couch to carry water to her family member. Patient noted a sharp shooting pain down the side and back of her left leg starting in her hip. She states that she was able to ambulate throughout the rest of the day, but when she woke up this morning the pain was so severe that she has not been able to walk. Patient is normally ambulatory with a cane. She she denies any falls, head trauma, LOC, dizziness, syncope. No saddle anesthesia, bowel bladder incontinence. She states she is concerned for her blood clot, she does not have a history of this and is not on any anticoagulation. Per chart review, patient was seen with similar complaint in January 2020 diagnosed with sciatica, given short course of prednisone. Patient does not recall this visit however. She states that she has not seen a PCP in quite some time. She denies any shortness of breath, chest pain, unilateral leg swelling.  Denies any history of cancer, IVDU.    Past Medical History:  Diagnosis Date  . Depression   . Hypertension     Patient Active Problem List   Diagnosis Date Noted  . Pain in joint, shoulder region 07/13/2013    Past Surgical History:  Procedure Laterality Date  . ANKLE SURGERY    . TONSILLECTOMY       OB History   No obstetric history on file.     No family history on file.  Social History   Tobacco Use  . Smoking status: Never Smoker  . Smokeless tobacco: Never Used  Substance Use Topics  . Alcohol use: No  . Drug use: No    Home Medications Prior to Admission medications   Medication Sig Start Date End  Date Taking? Authorizing Provider  acetaminophen (TYLENOL) 500 MG tablet Take 1 tablet (500 mg total) by mouth every 6 (six) hours as needed. 07/14/19   Garald Balding, PA-C  lidocaine (LIDODERM) 5 % Place 1 patch onto the skin daily. Remove & Discard patch within 12 hours or as directed by MD 07/14/19   Garald Balding, PA-C    Allergies    Patient has no known allergies.  Review of Systems   Review of Systems  Constitutional: Negative for chills and fever.  HENT: Negative for ear pain and sore throat.   Eyes: Negative for pain and visual disturbance.  Respiratory: Negative for cough and shortness of breath.   Cardiovascular: Negative for chest pain and palpitations.  Gastrointestinal: Negative for abdominal pain and vomiting.  Genitourinary: Negative for dysuria and hematuria.  Musculoskeletal: Positive for arthralgias and back pain. Negative for joint swelling, myalgias, neck pain and neck stiffness.  Skin: Negative for color change and rash.  Neurological: Negative for dizziness, seizures, syncope, weakness and headaches.  Psychiatric/Behavioral: Negative for confusion.  All other systems reviewed and are negative.   Physical Exam Updated Vital Signs BP (!) 183/75   Pulse 60   Temp 98.2 F (36.8 C) (Oral)   Resp 15   SpO2 100%   Physical Exam Vitals and nursing note reviewed.  Constitutional:  General: She is not in acute distress.    Appearance: She is well-developed.  HENT:     Head: Normocephalic and atraumatic.  Eyes:     Conjunctiva/sclera: Conjunctivae normal.  Cardiovascular:     Rate and Rhythm: Normal rate and regular rhythm.     Pulses:          Dorsalis pedis pulses are 2+ on the right side and 2+ on the left side.     Heart sounds: No murmur.  Pulmonary:     Effort: Pulmonary effort is normal. No respiratory distress.     Breath sounds: Normal breath sounds.  Abdominal:     Palpations: Abdomen is soft.     Tenderness: There is no abdominal  tenderness.  Musculoskeletal:        General: Normal range of motion.     Cervical back: Neck supple.     Comments: Reproducible left SI joint/ASIS tenderness to palpation to left hip.  Mild tenderness to palpation to lateral thigh, knee and just below the shin.  No evidence of erythema, unilateral swelling.  Homans negative.  Sensations intact in upper and lower extremities.  5/5 hip flexion, 5/5 knee abduction, 5/5 knee adduction, 5/5 knee flexion, 5/5 knee extension, 5/5 dorsiflexion, 4/5 plantar flexion bilaterally.  No C, T, L-spine midline tenderness.  No evidence of effusion on left or right knee.  Skin:    General: Skin is warm and dry.     Findings: No bruising, erythema, lesion or rash.     Comments: No erythema, unilateral swelling, fluctuance, drainage, ecchymosis, discoloration on left and right flank and R+L LE   Neurological:     General: No focal deficit present.     Mental Status: She is alert and oriented to person, place, and time.     ED Results / Procedures / Treatments   Labs (all labs ordered are listed, but only abnormal results are displayed) Labs Reviewed  BASIC METABOLIC PANEL    EKG None  Radiology DG Lumbar Spine Complete  Result Date: 07/14/2019 CLINICAL DATA:  Hip and back pain EXAM: LUMBAR SPINE - COMPLETE 4+ VIEW COMPARISON:  Concurrent pelvis radiograph. FINDINGS: 5 lumbar type vertebral bodies are visualized. Preservation of the lumbar lordosis. There is grade 1 anterolisthesis of L3 on L4. Finding is likely on a degenerative basis given the discogenic and facet degenerative changes at this level. No clear spondylolysis on oblique radiographs. No abnormally widened, jumped or perched facets. No acute fracture or vertebral body height loss. Posterior elements appear intact. Diffuse discogenic and facet degenerative changes throughout the lumbar spine most pronounced towards the lower lumbar levels. Interspinous arthrosis noted L3-S1. Atherosclerotic  calcification of the abdominal aorta. Additional coarse calcification in the left upper quadrant likely calcification within the splenic artery as this is external to the expected location of the kidneys. Remaining soft tissues are unremarkable. Mild degenerative changes in the SI joints better seen on pelvic radiographs. IMPRESSION: 1. No acute bony abnormality. 2. Diffuse discogenic and facet degenerative change most pronounced towards the lower lumbar levels. 3. Grade 1 anterolisthesis of L3 on L4. Finding is likely on a degenerative basis. No visible spondylolysis. 4. Aortic atherosclerosis. Electronically Signed   By: Lovena Le M.D.   On: 07/14/2019 17:30   DG Knee Complete 4 Views Left  Result Date: 07/14/2019 CLINICAL DATA:  Atraumatic left lower extremity pain EXAM: LEFT KNEE - COMPLETE 4+ VIEW COMPARISON:  Radiograph 05/01/2010 FINDINGS: Severe tricompartmental osteoarthrosis with joint space narrowing most  pronounced in the medial and patellofemoral compartments. There is extensive periarticular spurring, subchondral sclerosis and cystic changes noted throughout the knee. A small joint effusion is present. Calcified body along the anterosuperior joint recess may reflect an intra-articular loose body. Additional loose body suspected posterior to the joint line in addition to the corticated fabella. No definite acute bony abnormality. Specifically, no fracture, subluxation, or dislocation. IMPRESSION: 1. Progressive severe tricompartmental osteoarthrosis with small joint effusion. 2. Suspected intra-articular loose bodies. 3. No definite acute bony abnormality. Electronically Signed   By: Lovena Le M.D.   On: 07/14/2019 17:27   DG Hip Unilat W or Wo Pelvis 2-3 Views Left  Result Date: 07/14/2019 CLINICAL DATA:  Left hip pain, nontraumatic which began 2 days prior EXAM: DG HIP (WITH OR WITHOUT PELVIS) 2-3V LEFT COMPARISON:  Concurrent lumbar radiographs. FINDINGS: No acute bony abnormality.  Specifically, no acute fracture or traumatic malalignment. There is severe left femoroacetabular arthrosis with effacement of the joint space and mild acetabular protrusio. Extensive subchondral sclerotic and cystic changes likely reflecting some geode formation. Periarticular spurring is present as well. More mild degenerative changes seen in the right hip. Additional degenerative changes seen in the lower lumbar spine and bilateral SI joints. Soft tissues are unremarkable. IMPRESSION: 1. No acute osseous abnormality. 2. 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 3. Mild right hip degenerative changes. Electronically Signed   By: Lovena Le M.D.   On: 07/14/2019 17:24   VAS [place] LOWER EXTREMITY VENOUS (DVT) (ONLY [facility] & [facility])  Result Date: 07/14/2019  Lower Venous DVTStudy Indications: Swelling.  Risk Factors: None identified. Limitations: Body habitus and poor ultrasound/tissue interface. Comparison Study: No prior studies. Performing Technologist: Oliver Hum RVT  Examination Guidelines: A complete evaluation includes B-mode imaging, spectral Doppler, color Doppler, and power Doppler as needed of all accessible portions of each vessel. Bilateral testing is considered an integral part of a complete examination. Limited examinations for reoccurring indications may be performed as noted. The reflux portion of the exam is performed with the patient in reverse Trendelenburg.  +-----+---------------+---------+-----------+----------+--------------+ RIGHTCompressibilityPhasicitySpontaneityPropertiesThrombus Aging +-----+---------------+---------+-----------+----------+--------------+ CFV  Full           Yes      Yes                                 +-----+---------------+---------+-----------+----------+--------------+   +---------+---------------+---------+-----------+----------+--------------+ LEFT     CompressibilityPhasicitySpontaneityPropertiesThrombus Aging  +---------+---------------+---------+-----------+----------+--------------+ CFV      Full           Yes      Yes                                 +---------+---------------+---------+-----------+----------+--------------+ SFJ      Full                                                        +---------+---------------+---------+-----------+----------+--------------+ FV Prox  Full                                                        +---------+---------------+---------+-----------+----------+--------------+  FV Mid   Full                                                        +---------+---------------+---------+-----------+----------+--------------+ FV DistalFull                                                        +---------+---------------+---------+-----------+----------+--------------+ PFV      Full                                                        +---------+---------------+---------+-----------+----------+--------------+ POP      Full           Yes      Yes                                 +---------+---------------+---------+-----------+----------+--------------+ PTV      Full                                                        +---------+---------------+---------+-----------+----------+--------------+ PERO     Full                                                        +---------+---------------+---------+-----------+----------+--------------+     Summary: RIGHT: - No evidence of common femoral vein obstruction.  LEFT: - There is no evidence of deep vein thrombosis in the lower extremity. However, portions of this examination were limited- see technologist comments above.  - No cystic structure found in the popliteal fossa.  *See table(s) above for measurements and observations.    Preliminary     Procedures Procedures (including critical care time)  Medications Ordered in ED Medications  lidocaine (LIDODERM) 5 % 1 patch (1 patch  Transdermal Patch Applied 07/14/19 1818)  acetaminophen (TYLENOL) tablet 650 mg (650 mg Oral Given 07/14/19 1447)  ketorolac (TORADOL) injection 60 mg (60 mg Intramuscular Given 07/14/19 1815)    ED Course  I have reviewed the triage vital signs and the nursing notes.  Pertinent labs & imaging results that were available during my care of the patient were reviewed by me and considered in my medical decision making (see chart for details).    MDM Rules/Calculators/A&P                     74 year old female with left-sided radiating hip/leg pain. On presentation to the ER, patient is alert and oriented, in no acute distress, resting in ER bed.  Patient is hypertensive in the ED initially, but this improved through the ED course.  Discal exam positive for reproducible left  ASIS/SI joint tenderness, point tenderness to her left lateral thigh/knee/upper shin.  No evidence of unilateral swelling, erythema.  I suspect this is an inflammation of her sciatica.  Asked nursing staff to ambulate the patient, patient had significant difficulty ambulating, though she was able weight bear and take a few steps with a walker.  She reports severe pain that she has never had before.  Dr. Alvino Chapel also saw and evaluated the patient, we will get imaging of her hip and knee as well as lumbar spine.  We will also add on a ultrasound DVT study for rule out of blood clot.  BMP without acute abnormalities, checked her renal function as there have been no recent labs to compare her values to.  Also consulted transitions of care as the patient will likely need assistance in home health as she seems to have lost quite a bit of mobility and her brother raises concern about this.  Plain films with severe osteoarthritis to the left knee, left hip and chronic degenerative changes to the L-spine with anterolisthesis thesis at L3-4 which is likely chronic.  There are no acute changes on her plain films.  DVT ultrasound negative for  clot.  Suspect sciatica/radicular elements along with severe worsening osteoarthritis.  Transitions Of Care  met with the patient, we have ordered PT, OT, home health nurse for support.  Orthopedics referral provided and stressed followup. She received Toradol injection and noted mild-moderate improvement on re-evaluation. Her strength and sensations remain intact.  I encouraged the patient to continue to take Tylenol for pain management and symptomatically use Lidoderm patches for pain relief.  She voices understanding is agreeable to this plan.  Strict return precautions given.  Discussed the case with Dr. Alvino Chapel (who also saw and evaluated the patient), along with Dr. Sedonia Small and they are agreeable to the above plan.   Final Clinical Impression(s) / ED Diagnoses Final diagnoses:  Left sided sciatica  Primary osteoarthritis of left hip  Osteoarthritis of left knee, unspecified osteoarthritis type  Chronic bilateral low back pain with left-sided sciatica    Rx / DC Orders ED Discharge Orders         Ordered    lidocaine (LIDODERM) 5 %  Every 24 hours     07/14/19 1847    acetaminophen (TYLENOL) 500 MG tablet  Every 6 hours PRN     07/14/19 1849           Lyndel Safe 07/14/19 1901    Davonna Belling, MD 07/15/19 1641

## 2019-07-14 NOTE — Progress Notes (Signed)
Left lower extremity venous duplex has been completed. Preliminary results can be found in CV Proc through chart review.  Results were given to Trudee Grip PA.  07/14/19 5:42 PM Olen Cordial RVT

## 2019-07-14 NOTE — ED Triage Notes (Signed)
Per [facility] pt c/o no traumatic left leg pain that started 2 days ago. Pt is from home.  Vitals: 158/70, 60HR, 95% on RA.

## 2019-07-14 NOTE — ED Notes (Signed)
Pt in radiology 

## 2019-07-14 NOTE — ED Notes (Signed)
PTAR notified of transport needed to residence.

## 2019-07-14 NOTE — TOC Initial Note (Signed)
Transition of Care [facility]) - Initial/Assessment Note    Patient Details  Name: Janayia Burggraf MRN: [id number] Date of Birth: [birth year]  [facility] CM/SW Contact:    Elliot Cousin, RN Phone Number: [phone]  07/14/2019, 6:07 PM  Clinical Narrative:                 TOC CM spoke to pt and offered choice for [facility]. Provided pt list of HH. Pt states she wanted to see her PCP before starting HH. Pt reports having RW at home. Referral sent to [facility] to continue to follow for [facility]. ED provider updated and request for HHPT, OT and aide with F2F needed.   Expected Discharge Plan: Home w Home Health Services Barriers to Discharge: Continued Medical Work up   Patient Goals and CMS Choice Patient states their goals for this hospitalization and ongoing recovery are:: i would rather see my PCP before starting Home Health CMS Medicare.gov Compare Post Acute Care list provided to:: Patient Choice offered to / list presented to : Patient  Expected Discharge Plan and Services Expected Discharge Plan: Home w Home Health Services In-house Referral: Clinical Social Work Discharge Planning Services: CM Consult Post Acute Care Choice: Home Health Living arrangements for the past 2 months: Single Family Home                                      Prior Living Arrangements/Services Living arrangements for the past 2 months: Single Family Home Lives with:: Parents Patient language and need for interpreter reviewed:: Yes Do you feel safe going back to the place where you live?: Yes      Need for Family Participation in Patient Care: No (Comment) Care giver support system in place?: No (comment) Current home services: DME(rolling walker) Criminal Activity/Legal Involvement Pertinent to Current Situation/Hospitalization: No - Comment as needed  Activities of Daily Living      Permission Sought/Granted Permission sought to share information with : Case Manager, PCP, Family  Supports Permission granted to share information with : Yes, Verbal Permission Granted  Share Information with NAME: Archer Asa  Permission granted to share info w AGENCY: Home Health, DME agency  Permission granted to share info w Relationship: sister  Permission granted to share info w Contact Information: [phone]  Emotional Assessment       Orientation: : Oriented to Self, Oriented to Place, Oriented to  Time, Oriented to Situation   Psych Involvement: No (comment)  Admission diagnosis:  leg pain Patient Active Problem List   Diagnosis Date Noted  . Pain in joint, shoulder region 07/13/2013   PCP:  Virl Cagey, MD Pharmacy:   [facility] [phone] - [place], [place] - [address] AT [facility] OF [address]  [place] [address] Phone: [phone] Fax: [phone]  [id number] [place], [place] - [address] AT [facility] OF [address] [place] [address] Phone: [phone] Fax: [phone]     Social Determinants of Health (SDOH) Interventions    Readmission Risk Interventions No flowsheet data found.

## 2019-07-14 NOTE — ED Notes (Signed)
Pt returned from radiology.

## 2019-07-14 NOTE — Discharge Instructions (Addendum)
Your x-ray showed severe arthritis in your left knee, hip and low back.  I provided a follow-up with [facility] to further evaluate and manage your arthritis.  Continue to use the Lidoderm patch over the area of your pain.  Continue to take Tylenol for pain management.  We have also set you up for home health, physical therapy and occupational therapy.  Please call the phone number on your discharge paperwork to set up a new primary care doctor.  Return to the ER if your symptoms worsen.

## 2019-07-14 NOTE — ED Notes (Signed)
Vascular tech at bedside. °

## 2020-02-29 DIAGNOSIS — Z0001 Encounter for general adult medical examination with abnormal findings: Secondary | ICD-10-CM | POA: Diagnosis not present

## 2020-02-29 DIAGNOSIS — F331 Major depressive disorder, recurrent, moderate: Secondary | ICD-10-CM | POA: Diagnosis not present

## 2020-02-29 DIAGNOSIS — Z1159 Encounter for screening for other viral diseases: Secondary | ICD-10-CM | POA: Diagnosis not present

## 2020-02-29 DIAGNOSIS — R251 Tremor, unspecified: Secondary | ICD-10-CM | POA: Diagnosis not present

## 2020-02-29 DIAGNOSIS — Z008 Encounter for other general examination: Secondary | ICD-10-CM | POA: Diagnosis not present

## 2020-02-29 DIAGNOSIS — E785 Hyperlipidemia, unspecified: Secondary | ICD-10-CM | POA: Diagnosis not present

## 2020-02-29 DIAGNOSIS — Z6841 Body Mass Index (BMI) 40.0 and over, adult: Secondary | ICD-10-CM | POA: Diagnosis not present

## 2020-02-29 DIAGNOSIS — M19012 Primary osteoarthritis, left shoulder: Secondary | ICD-10-CM | POA: Diagnosis not present

## 2020-02-29 DIAGNOSIS — Z79899 Other long term (current) drug therapy: Secondary | ICD-10-CM | POA: Diagnosis not present

## 2020-02-29 DIAGNOSIS — M19011 Primary osteoarthritis, right shoulder: Secondary | ICD-10-CM | POA: Diagnosis not present

## 2020-06-20 ENCOUNTER — Ambulatory Visit (INDEPENDENT_AMBULATORY_CARE_PROVIDER_SITE_OTHER): Payer: Medicare Other | Admitting: Neurology

## 2020-06-20 ENCOUNTER — Encounter: Payer: Self-pay | Admitting: Neurology

## 2020-06-20 VITALS — BP 177/81 | HR 61

## 2020-06-20 DIAGNOSIS — R251 Tremor, unspecified: Secondary | ICD-10-CM

## 2020-06-20 DIAGNOSIS — G250 Essential tremor: Secondary | ICD-10-CM | POA: Diagnosis not present

## 2020-06-20 NOTE — Patient Instructions (Signed)
You have a rather mild tremor of both hands. Your tremor is more noticeable in your voice and in your head.  Unfortunately, a head tremor is more difficult to treat and may not respond as well to medication as opposed to the hand tremor.  I do believe your hand tremor has responded to the beta-blocker you are currently on, namely metoprolol.  However, you have also had some side effects from it including low blood pressure values at times and some sleepiness from it.  Your pulse rate is on the lower end of normal and increasing this medication may reduce your blood pressure and your heart rate.  I would not recommend increasing it at this time.  Please follow-up to see your primary care physician as scheduled.  I do not see any signs or symptoms of parkinson's like disease or what we call parkinsonism.   For your tremor, I would not recommend any new medication for fear of side effects (especially sleepiness, low heart rate, low blood pressure, balance issues).  We do not have to make a follow up appointment, but I would be happy to see you back as needed.  Please try to reduce your caffeine intake by reducing your soda to 1 or 2 servings per day as opposed to 2-3.  Please remember, that any kind of tremor may be exacerbated by anxiety, anger, nervousness, excitement, thyroid disease, dehydration, sleep deprivation, by caffeine, and low blood sugar values or blood sugar fluctuations.

## 2020-06-20 NOTE — Progress Notes (Signed)
Subjective:    Patient ID: Amy Rivera is a 75 y.o. female.  HPI     Huston Foley, MD, PhD [facility] [address], [address] [address] [place], [place] [address]  Dear Dr. Allena Katz,    I saw your patient, Amy Rivera, upon your kind request in my neurologic clinic today for initial consultation of her tremor.  The patient is unaccompanied today.  As you know, [name] is a 75 year old right-handed woman with an underlying medical history of osteoarthritis, depression, hyperlipidemia, and severe obesity with a BMI of over 40, who reports a right-sided tremor affecting primarily the right hand for the past approximately 2 years.  Her primary care physician, Dr. Randa Lynn had started metoprolol, she is currently on 25 mg once daily long-acting.  She tolerates it but has had some side effects including sleepiness at times.  She also had some low blood pressure values, 1 time she checked it at home and it was 91/60 something.  She checked it a little later and her systolic blood pressure had improved to 112.  She has not had any recent falls but walks with a cane.  She has lower extremity swelling, mostly on the right side.  She fractured her right ankle in 1986, she had hardware for 1 year which was then removed.  She has tremors primarily when she tries to write or hold something particularly with the right hand.  She noticed her head tremor around the same time.  She has no family history of tremors.  She lives with her mother who is 26.  She lost her brother last year, he was 12 years younger.  She has 2 sisters living, one is 1 year younger and the other is 1-1/2 years older, neither 1 with tremors.  She has noticed exacerbation of her tremor when she is anxious or stressed out.  I reviewed your office note from 02/29/2020.  She had blood work through your office on 02/29/2020 and I reviewed the results: Lipid panel showed total cholesterol of 242, HDL 51, LDL 163,  triglycerides 139.  BUN was 20, creatinine 0.76, glucose 93, sodium 141, potassium 5.0, AST 12, ALT 6, alkaline phosphatase 83.  CBC with differential and platelets showed hemoglobin elevated at 15.7, hematocrit elevated at 48.7, otherwise unremarkable.  Hepatitis C antibody nonreactive.  TSH was 1.28.  Her Past Medical History Is Significant For: Past Medical History:  Diagnosis Date  . Depression   . Hypertension     Her Past Surgical History Is Significant For: Past Surgical History:  Procedure Laterality Date  . ANKLE SURGERY    . TONSILLECTOMY      Her Family History Is Significant For: No family history on file.  Her Social History Is Significant For: Social History   Socioeconomic History  . Marital status: Divorced    Spouse name: Not on file  . Number of children: Not on file  . Years of education: Not on file  . Highest education level: Not on file  Occupational History  . Not on file  Tobacco Use  . Smoking status: Never Smoker  . Smokeless tobacco: Never Used  Substance and Sexual Activity  . Alcohol use: No  . Drug use: No  . Sexual activity: Not on file  Other Topics Concern  . Not on file  Social History Narrative  . Not on file   Social Determinants of Health   Financial Resource Strain: Not on file  Food Insecurity: Not on  file  Transportation Needs: Not on file  Physical Activity: Not on file  Stress: Not on file  Social Connections: Not on file    Her Allergies Are:  No Known Allergies:   Her Current Medications Are:  Outpatient Encounter Medications as of 06/20/2020  Medication Sig  . acetaminophen (TYLENOL) 500 MG tablet Take 1 tablet (500 mg total) by mouth every 6 (six) hours as needed.  . metoprolol succinate (TOPROL-XL) 25 MG 24 hr tablet Take 1 tablet by mouth daily.  . [DISCONTINUED] lidocaine (LIDODERM) 5 % Place 1 patch onto the skin daily. Remove & Discard patch within 12 hours or as directed by MD   No facility-administered  encounter medications on file as of 06/20/2020.  :   Review of Systems:  Out of a complete 14 point review of systems, all are reviewed and negative with the exception of these symptoms as listed below:   Review of Systems  Neurological:       Here to to discuss worsening tremors. She also reports pain in bilateral in her legs. She sts the tremors are located in her head and right hand. Reports she is dominate on her right hand, reports sx have been present for 2 years or so. She is on metoprolol 25 mg once daily and feels like it helps some.     Objective:  Neurological Exam  Physical Exam Physical Examination:   Vitals:   06/20/20 0956  BP: (!) 177/81  Pulse: 61    General Examination: The patient is a very pleasant 75 y.o. female in no acute distress. She appears well-developed and well-nourished and well groomed. She is in a clinic transport chair as she had trouble walking from the lobby to the examination room.  She brought her single-point cane.  HEENT: Normocephalic, atraumatic, pupils are equal, round and reactive to light, extraocular tracking is preserved.  She has bilateral cataracts.  She has not had an eye examination in some years. Hearing is grossly intact. Face is symmetric with normal facial animation.  She has a mild voice tremor.  She has a side-to-side head tremor.  Neck is supple with full range of motion otherwise.  No carotid bruits.  Airway examination reveals adequate dental hygiene, some missing teeth.  She has moderate airway crowding.  Tongue protrudes centrally and palate elevates symmetrically.   Chest: Clear to auscultation without wheezing, rhonchi or crackles noted.  Heart: S1+S2+0, regular and normal without murmurs, rubs or gallops noted.   Abdomen: Soft, non-tender and non-distended with normal bowel sounds appreciated on auscultation.  Extremities: There is 1-2+ edema in the right distal lower extremity and 1+ edema in the left distal lower  extremity, she has significant swelling around the right ankle.  Skin: Warm and dry without trophic changes noted. There are no .  Musculoskeletal: exam reveals scar medial right ankle with swelling noted.     Neurologically:  Mental status: The patient is awake, alert and oriented in all 4 spheres. Her immediate and remote memory, attention, language skills and fund of knowledge are appropriate. There is no evidence of aphasia, agnosia, apraxia or anomia. Speech is clear with normal prosody and enunciation. Thought process is linear. Mood is normal and affect is normal.  Cranial nerves II - XII are as described above under HEENT exam. In addition: shoulder shrug is normal with equal shoulder height noted. Motor exam: Normal bulk, clinical strength of 4+ out of 5, normal tone, no cogwheeling, no resting tremor noted in the  upper or lower extremities.  On 06/20/2020: On Archimedes spiral drawing she has mild trembling with the right hand, mild insecurity but no obvious consistent trembling with the left upper extremity, handwriting is legible, slightly tremulous, not micrographic.  She has a slight postural tremor in both upper extremities, mild action tremor in both upper extremities, no resting tremor, no intention tremor.  No drift, Romberg is not tested due to safety concerns.  Reflexes are 1+ in the upper extremities and difficult to elicit/absent in the lower extremities.  Fine motor skills and coordination: Grossly intact with hand movements and foot movements.  Cerebellar testing: No dysmetria or intention tremor on finger to nose testing.   Sensory exam: intact to light touch in the upper and lower extremities.  Gait, station and balance: We were not able to assess gait and posture, she had trouble getting out of the transport chair and we did not have immediate assistance.  She was not able to pull herself up holding onto the writing task or a chair in front of her.                  Assessment and Plan:  In summary, Tedra Coppernoll is a very pleasant 75 y.o.-year old female with an underlying medical history of osteoarthritis, depression, hyperlipidemia, and severe obesity with a BMI of over 40, who presents for evaluation of her tremor disorder.  She has noticed a hand tremor in the past 2 years.  On examination, she has a head and voice tremor which is more noticeable than her hand tremor at this time.  She does report some improvement with the metoprolol, she is currently on 25 mg once daily long-acting.  She has had some side effects including some sleepiness and at least once she took her blood pressure at home and it was low.  We talked about possible side effects from taking a beta-blocker including bradycardia.  She has a pulse rate of close to 60 at this time, increasing the metoprolol could cause hypotension and bradycardia.  I would not recommend increasing this at this time.  She is advised that sometimes we utilize a medication for tremor control called Mysoline but this can cause sleepiness and balance issues and I would not favor this medication for her.  She has had some trouble walking, she uses a cane, she was in a transport chair today and had trouble getting out of the chair, we will assist her to her car today.  She drove herself to the appointment.  She was reassured that there is no evidence of parkinsonism.  She could have a mild form of essential tremor.  She has noticed exacerbation when she is anxious or stressed out.  We talked about other tremor triggers including caffeine, sleep deprivation.  She is advised to curb her caffeine intake and limit herself to 1 or 2 servings per day if possible.  At this juncture, she is advised to follow-up with your office on a scheduled basis.  I would be happy to see her back as needed.  She is advised to make an appointment to see her eye doctor as she has not had an eye exam in some years.  She is advised to stay well-hydrated  with water.  I answered all her questions today and she was in agreement.   Thank you very much for allowing me to participate in the care of this nice patient. If I can be of any further assistance to you please  do not hesitate to call me at [phone].  Sincerely,   Star Age, MD, PhD

## 2020-12-03 IMAGING — CR DG LUMBAR SPINE COMPLETE 4+V
7 series · 7 of 7 positions shown · non-contrast
Comparison: Concurrent pelvis radiograph.

CLINICAL DATA: Hip and back pain

EXAM:
LUMBAR SPINE - COMPLETE 4+ VIEW

[t lumbar spine ap (1 of 2)]
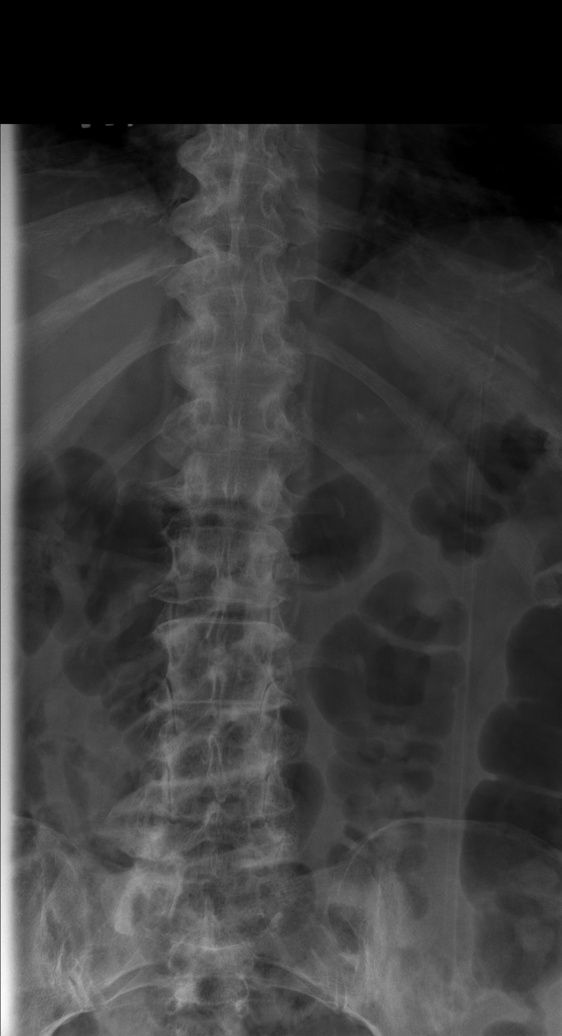

[t lumbar spine ap (2 of 2)]
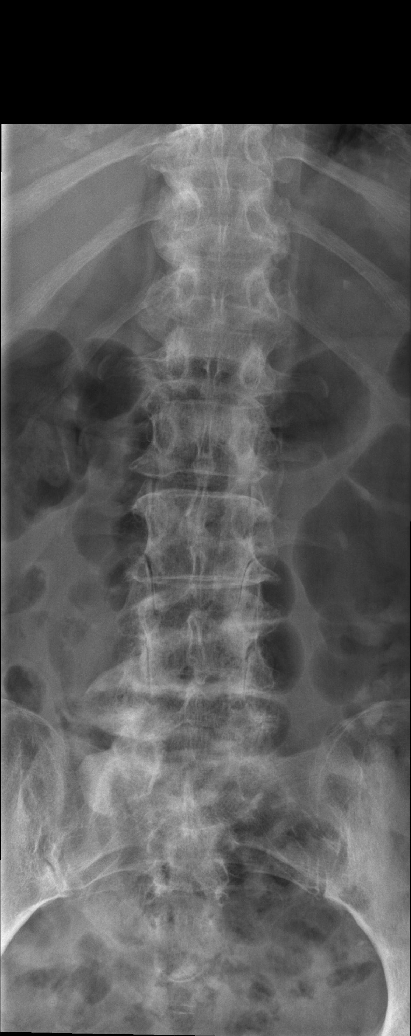

[t lumbar spine obl (1 of 3)]
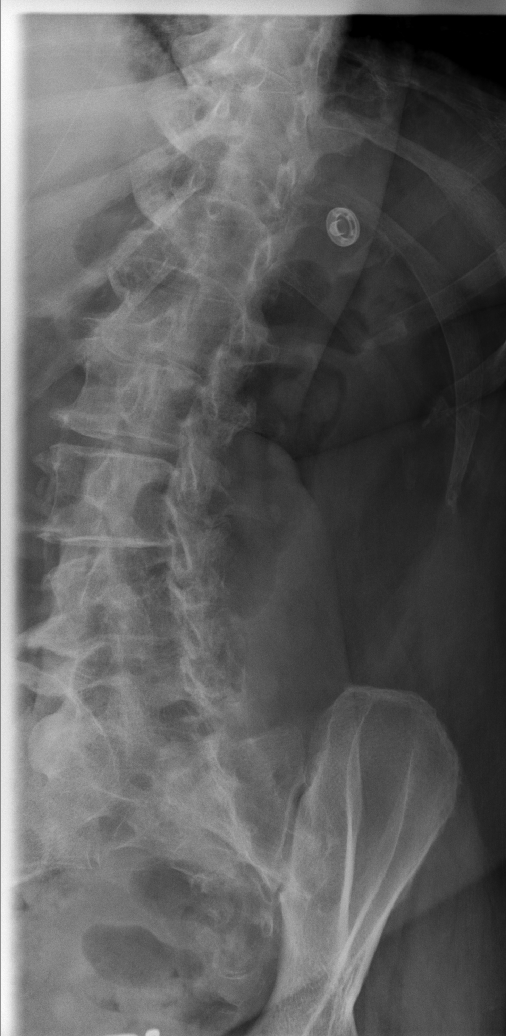

[t lumbar spine obl (2 of 3)]
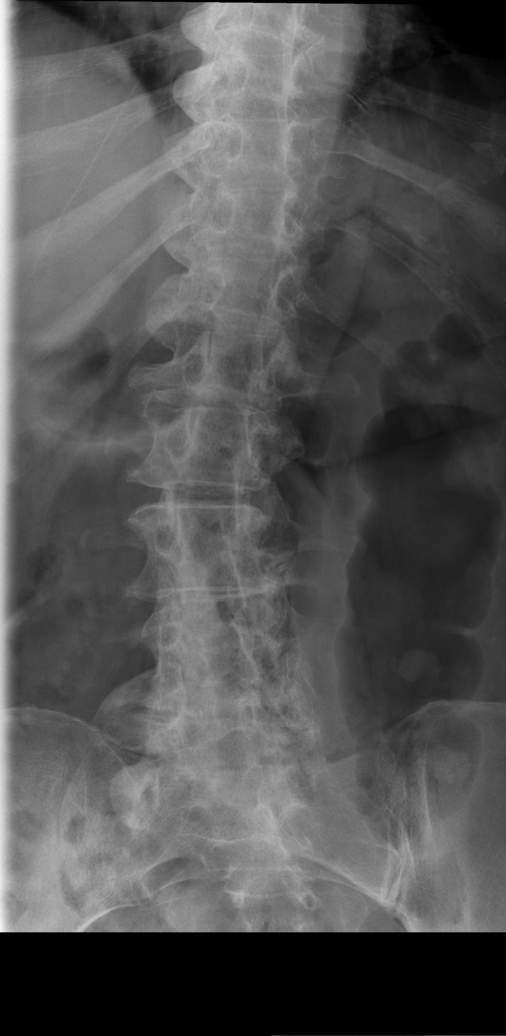

[t lumbar spine obl (3 of 3)]
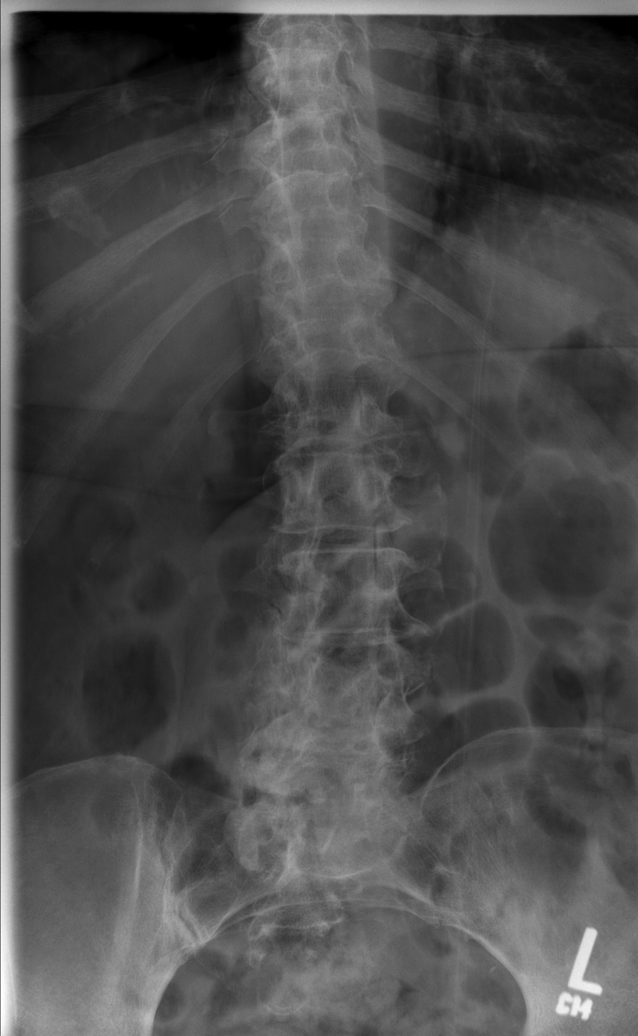

[t lumbar spine lat]
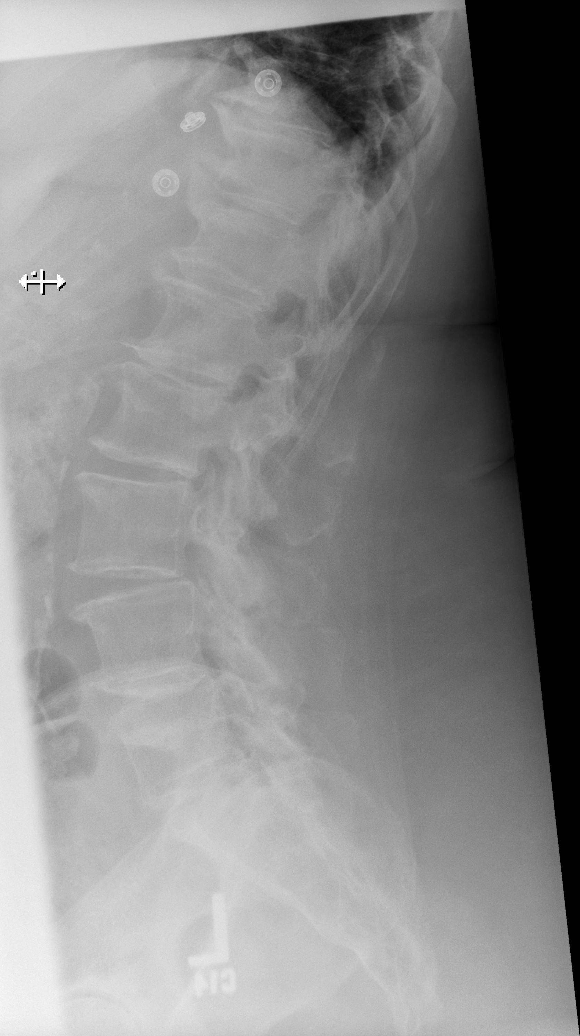

[t lumbar l-5 s-1 spot]
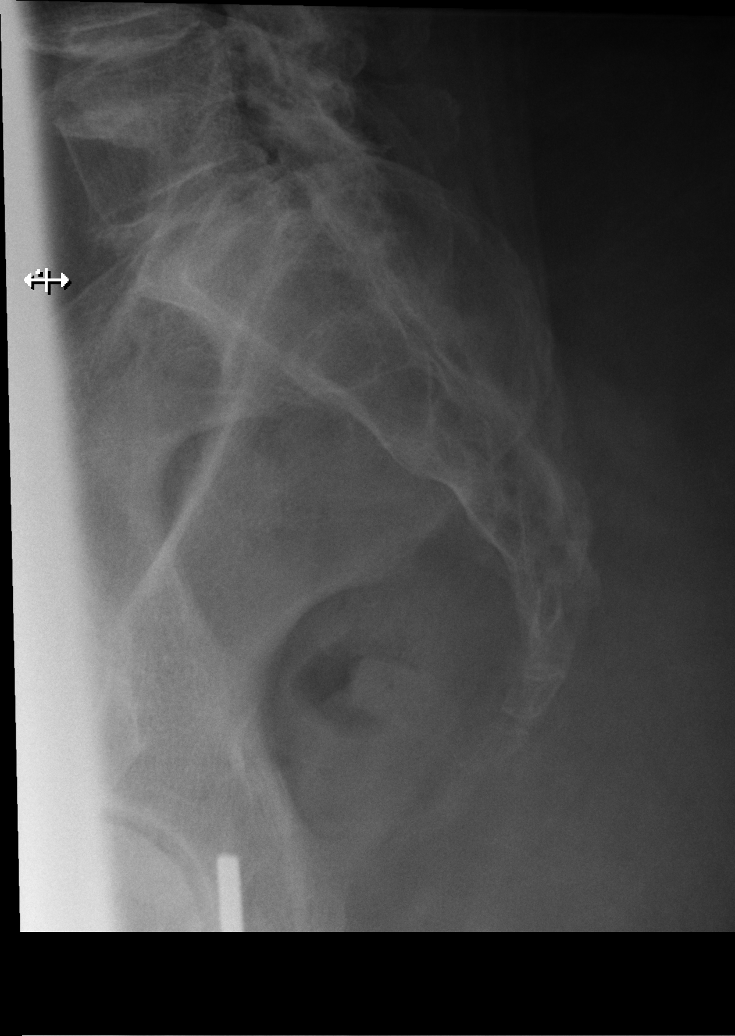

[7 of 7 positions shown; findings below may reference images not displayed]

FINDINGS: 5 lumbar type vertebral bodies are visualized. Preservation of the
lumbar lordosis. There is grade 1 anterolisthesis of L3 on L4.
Finding is likely on a degenerative basis given the discogenic and
facet degenerative changes at this level. No clear spondylolysis on
oblique radiographs. No abnormally widened, jumped or perched
facets. No acute fracture or vertebral body height loss. Posterior
elements appear intact. Diffuse discogenic and facet degenerative
changes throughout the lumbar spine most pronounced towards the
lower lumbar levels. Interspinous arthrosis noted L3-S1.
Atherosclerotic calcification of the abdominal aorta. Additional
coarse calcification in the left upper quadrant likely calcification
within the splenic artery as this is external to the expected
location of the kidneys. Remaining soft tissues are unremarkable.
Mild degenerative changes in the SI joints better seen on pelvic
radiographs.
IMPRESSION: 1. No acute bony abnormality.
2. Diffuse discogenic and facet degenerative change most pronounced
towards the lower lumbar levels.
3. Grade 1 anterolisthesis of L3 on L4. Finding is likely on a
degenerative basis. No visible spondylolysis.
4. Aortic atherosclerosis.

## 2021-12-19 ENCOUNTER — Encounter (HOSPITAL_COMMUNITY): Payer: Self-pay | Admitting: Emergency Medicine

## 2021-12-19 ENCOUNTER — Ambulatory Visit (INDEPENDENT_AMBULATORY_CARE_PROVIDER_SITE_OTHER): Payer: Medicare Other

## 2021-12-19 ENCOUNTER — Ambulatory Visit (HOSPITAL_COMMUNITY)
Admission: EM | Admit: 2021-12-19 | Discharge: 2021-12-19 | Disposition: A | Discharge status: Home / Self Care | Payer: Medicare Other | Attending: Internal Medicine | Admitting: Internal Medicine

## 2021-12-19 DIAGNOSIS — S90211A Contusion of right great toe with damage to nail, initial encounter: Secondary | ICD-10-CM | POA: Diagnosis not present

## 2021-12-19 DIAGNOSIS — M79674 Pain in right toe(s): Secondary | ICD-10-CM | POA: Diagnosis not present

## 2021-12-19 MED ORDER — TETANUS-DIPHTH-ACELL PERTUSSIS 5-2.5-18.5 LF-MCG/0.5 IM SUSY
0.5000 mL | PREFILLED_SYRINGE | Freq: Once | INTRAMUSCULAR | Status: AC
  Administered 2021-12-19: 0.5 mL via INTRAMUSCULAR

## 2021-12-19 MED ORDER — TETANUS-DIPHTH-ACELL PERTUSSIS 5-2.5-18.5 LF-MCG/0.5 IM SUSY
PREFILLED_SYRINGE | INTRAMUSCULAR | Status: AC
  Filled 2021-12-19: qty 0.5

## 2021-12-19 NOTE — ED Triage Notes (Signed)
Pt reports right great toe pain. States she hit her toe on a cabinet Sunday causing her nail to push up. Believes nail is halfway off. Last tetanus unknown.

## 2021-12-19 NOTE — ED Provider Notes (Signed)
[facility]   [id number] 12/19/21 Arrival Time: 1100  ASSESSMENT & PLAN:  1. Contusion of right great toe with damage to nail, initial encounter    -76 year old female here for evaluation of right great toe pain after kicking a cabinet.  There is partial nail avulsion.  X-rays are negative for fracture.  Reassurance was provided.  Educated that the toenail will likely fall off on its own in the coming days.  No need for antibiotics at this time.  Will provide tetanus prophylaxis since has been many years since she had her last tetanus shot.  She can follow-up with primary as needed.  All questions answered she agrees to plan.  Meds ordered this encounter  Medications   Tdap (BOOSTRIX) injection 0.5 mL   Discharge Instructions   None       Reviewed expectations re: course of current medical issues. Questions answered. Outlined signs and symptoms indicating need for more acute intervention. Patient verbalized understanding. After Visit Summary given.   SUBJECTIVE: Pleasant 76 year old female comes urgent care to be evaluated for right great toe pain.  On Sunday she accidentally kicked a cabinet and caused her nail to be pushed up.  She had a history of toenail fungus there is of the nail was already raised up to a certain degree.  She developed pain and bleeding from the nailbed.  Has had pain in the toe ever since the injury.  She ambulates with a cane at baseline.  Denies any numbness or tingling in the foot.  Unsure of last tetanus.  No LMP recorded. Patient is postmenopausal. Past Surgical History:  Procedure Laterality Date   ANKLE SURGERY     TONSILLECTOMY       OBJECTIVE:  Vitals:   12/19/21 1157  BP: (!) 171/79  Pulse: 72  Resp: 18  Temp: 98.3 F (36.8 C)  TempSrc: Oral  SpO2: 96%     Physical Exam Vitals reviewed.  Constitutional:      Appearance: Normal appearance. She is not ill-appearing or toxic-appearing.  Cardiovascular:     Rate and  Rhythm: Normal rate.  Pulmonary:     Effort: Pulmonary effort is normal.  Musculoskeletal:     Comments: R Great Toe -there is significant fungal disease.  The nail is at a 45 degree angle with the cuticle.  There is no subungual hematoma.  There is no obvious laceration.  No bleeding, no purulent drainage.  She has full range of motion in flexion extension of the great toe.  Sensation is intact to light touch.      Labs: Results for orders placed or performed during the hospital encounter of 10/62/69  Basic metabolic panel  Result Value Ref Range   Sodium 139 135 - 145 mmol/L   Potassium 4.3 3.5 - 5.1 mmol/L   Chloride 104 98 - 111 mmol/L   CO2 25 22 - 32 mmol/L   Glucose, Bld 91 70 - 99 mg/dL   BUN 14 8 - 23 mg/dL   Creatinine, Ser 0.59 0.44 - 1.00 mg/dL   Calcium 9.3 8.9 - 10.3 mg/dL   GFR calc non Af Amer >60 >60 mL/min   GFR calc Af Amer >60 >60 mL/min   Anion gap 10 5 - 15   Labs Reviewed - No data to display  Imaging: DG Toe Great Right  Result Date: 12/19/2021 CLINICAL DATA:  Right great toe pain after injury. EXAM: RIGHT GREAT TOE COMPARISON:  None Available. FINDINGS: There is no evidence of  fracture or dislocation. There is no evidence of arthropathy or other focal bone abnormality. Soft tissues are unremarkable. IMPRESSION: Negative. Electronically Signed   By: Lupita Raider M.D.   On: 12/19/2021 12:27     No Known Allergies                                             Past Medical History:  Diagnosis Date   Depression    Hypertension     Social History   Socioeconomic History   Marital status: Divorced    Spouse name: Not on file   Number of children: Not on file   Years of education: Not on file   Highest education level: Not on file  Occupational History   Not on file  Tobacco Use   Smoking status: Never   Smokeless tobacco: Never  Substance and Sexual Activity   Alcohol use: No   Drug use: No   Sexual activity: Not on file  Other Topics  Concern   Not on file  Social History Narrative   Not on file   Social Determinants of Health   Financial Resource Strain: Not on file  Food Insecurity: Not on file  Transportation Needs: Not on file  Physical Activity: Not on file  Stress: Not on file  Social Connections: Not on file  Intimate Partner Violence: Not on file    History reviewed. No pertinent family history.    Trixie Maclaren, Baldemar Friday, MD 12/19/21 1251

## 2022-03-11 ENCOUNTER — Other Ambulatory Visit: Payer: Self-pay | Admitting: Family Medicine

## 2022-03-11 DIAGNOSIS — E2839 Other primary ovarian failure: Secondary | ICD-10-CM

## 2022-08-28 ENCOUNTER — Ambulatory Visit
Admission: RE | Admit: 2022-08-28 | Discharge: 2022-08-28 | Disposition: A | Discharge status: Home / Self Care | Payer: Medicare HMO | Source: Ambulatory Visit | Attending: Family Medicine | Admitting: Family Medicine

## 2022-08-28 DIAGNOSIS — E2839 Other primary ovarian failure: Secondary | ICD-10-CM

## 2023-03-25 ENCOUNTER — Ambulatory Visit: Payer: Medicare HMO | Admitting: Podiatry

## 2023-03-25 ENCOUNTER — Encounter: Payer: Self-pay | Admitting: Podiatry

## 2023-03-25 VITALS — Ht 67.0 in | Wt 300.0 lb

## 2023-03-25 DIAGNOSIS — I872 Venous insufficiency (chronic) (peripheral): Secondary | ICD-10-CM | POA: Diagnosis not present

## 2023-03-25 DIAGNOSIS — M79675 Pain in left toe(s): Secondary | ICD-10-CM | POA: Diagnosis not present

## 2023-03-25 DIAGNOSIS — R601 Generalized edema: Secondary | ICD-10-CM

## 2023-03-25 DIAGNOSIS — M79674 Pain in right toe(s): Secondary | ICD-10-CM

## 2023-03-25 DIAGNOSIS — B351 Tinea unguium: Secondary | ICD-10-CM | POA: Diagnosis not present

## 2023-03-25 DIAGNOSIS — B353 Tinea pedis: Secondary | ICD-10-CM | POA: Diagnosis not present

## 2023-03-25 MED ORDER — KETOCONAZOLE 2 % EX CREA: 1.0000 | TOPICAL_CREAM | Freq: Every day | CUTANEOUS | 2 refills | Status: AC

## 2023-03-25 NOTE — Progress Notes (Unsigned)
Subjective:  Patient ID: Amy Rivera, female    DOB: 11-19-1945,  MRN: [id number]  Amy Rivera presents to clinic today for:  Chief Complaint  Patient presents with   Nail Problem    new pt-onychomycosis- cannot reach feet anymore   Patient notes nails are thick and elongated, causing pain in shoe gear when ambulating.  Patient is still having a lot of issues with swelling in her legs and ankles.  As she is not able to don compression stockings herself.  She has no one at home to help her.  She has a 46 year old mother that she has to provide care for.  She does note some itching and dry skin to the bottom of both feet.  PCP is Brayton El Asad A, MD.Last seen around 01/14/2023  Past Medical History:  Diagnosis Date   Depression    Hypertension     No Known Allergies  Objective:  Amy Rivera is a pleasant 78 y.o. female in NAD. AAO x 3.  Vascular Examination: Patient has palpable DP pulse, absent PT pulse bilateral.  Delayed capillary refill bilateral toes.  Sparse digital hair bilateral.  Proximal to distal cooling WNL bilateral.  There is +2 pitting edema to the bilateral legs and ankles.  There is hemosiderin deposition in the lower one third aspect of both legs.  No blistering or active drainage is noted.  Bilateral ankle circumference was 28 cm and bilateral calf circumference was 46 cm  Dermatological Examination: Interspaces are clear with no open lesions noted bilateral.  Skin is shiny and atrophic bilateral.  Nails are 3-80mm thick, with yellowish/brown discoloration, subungual debris and distal onycholysis x10.  There is pain with compression of nails x10.  The skin on the plantar aspect of both feet is dry and flaky.  No deep fissures are noted.  Patient qualifies for at-risk foot care because of venous insufficiency/generalized edema, pain and nails.  Assessment/Plan: 1. Pain due to onychomycosis of toenails of both feet   2. Venous insufficiency (chronic)  (peripheral)   3. Generalized edema   4. Tinea pedis of both feet     Meds ordered this encounter  Medications   ketoconazole (NIZORAL) 2 % cream    Sig: Apply 1 Application topically daily. Apply 1gm daily to skin on both feet.    Dispense:  60 g    Refill:  2   Mycotic nails x10 were sharply debrided with sterile nail nippers and power debriding burr to decrease bulk and length.  Prescription for ketoconazole 2% cream was sent to her pharmacy to apply 1 g to the plantar aspect of both feet once daily until the skin issue and itching have resolved.  Discussed getting the patient set up for at home lymphedema compression pumps.  I informed her that we will obtain measurements of the ankle and calves today as well as at her next visit and then can begin the process of having the representative to check her insurance coverage and be able to meet her at home to get her set up with these pumps.  She agreed to start the process of getting this initiated for her.   Return in about 3 months (around 06/23/2023) for RFC (& measure legs).   Clerance Lav, DPM, FACFAS [facility]     [address]  [place], [place] [address]                Office [phone]  Fax [phone]

## 2023-06-24 ENCOUNTER — Ambulatory Visit: Payer: Medicare HMO | Admitting: Podiatry
